# Patient Record
Sex: Male | Born: 1980 | Race: White | Hispanic: No | Smoking: Current every day smoker
Health system: Southern US, Community
[De-identification: ages and names within clinical notes are randomized; demographics above are authoritative.]

## PROBLEM LIST (undated history)

## (undated) DIAGNOSIS — R569 Unspecified convulsions: Secondary | ICD-10-CM

## (undated) DIAGNOSIS — F329 Major depressive disorder, single episode, unspecified: Secondary | ICD-10-CM

## (undated) DIAGNOSIS — F32A Depression, unspecified: Secondary | ICD-10-CM

## (undated) DIAGNOSIS — S62109A Fracture of unspecified carpal bone, unspecified wrist, initial encounter for closed fracture: Secondary | ICD-10-CM

## (undated) DIAGNOSIS — F419 Anxiety disorder, unspecified: Secondary | ICD-10-CM

## (undated) HISTORY — DX: Anxiety disorder, unspecified: F41.9

## (undated) HISTORY — DX: Fracture of unspecified carpal bone, unspecified wrist, initial encounter for closed fracture: S62.109A

## (undated) HISTORY — DX: Unspecified convulsions: R56.9

## (undated) HISTORY — DX: Depression, unspecified: F32.A

## (undated) HISTORY — DX: Major depressive disorder, single episode, unspecified: F32.9

---

## 2003-02-07 ENCOUNTER — Emergency Department (HOSPITAL_COMMUNITY): Admission: EM | Admit: 2003-02-07 | Discharge: 2003-02-07 | Payer: Self-pay | Admitting: Emergency Medicine

## 2003-02-07 ENCOUNTER — Encounter: Payer: Self-pay | Admitting: Emergency Medicine

## 2006-01-15 ENCOUNTER — Emergency Department: Payer: Self-pay | Admitting: Emergency Medicine

## 2006-06-18 ENCOUNTER — Emergency Department: Payer: Self-pay | Admitting: General Practice

## 2006-07-19 ENCOUNTER — Emergency Department: Payer: Self-pay | Admitting: Emergency Medicine

## 2006-07-29 ENCOUNTER — Ambulatory Visit: Payer: Self-pay | Admitting: Family Medicine

## 2006-08-12 ENCOUNTER — Emergency Department: Payer: Self-pay | Admitting: Unknown Physician Specialty

## 2006-08-12 ENCOUNTER — Emergency Department: Payer: Self-pay | Admitting: General Practice

## 2008-01-16 ENCOUNTER — Inpatient Hospital Stay: Payer: Self-pay | Admitting: Unknown Physician Specialty

## 2010-10-24 ENCOUNTER — Emergency Department: Payer: Self-pay | Admitting: Emergency Medicine

## 2011-10-19 ENCOUNTER — Ambulatory Visit: Payer: Self-pay

## 2013-01-19 ENCOUNTER — Ambulatory Visit: Payer: Self-pay | Admitting: Family Medicine

## 2013-02-16 ENCOUNTER — Ambulatory Visit: Payer: Self-pay | Admitting: Family Medicine

## 2013-02-17 ENCOUNTER — Ambulatory Visit: Payer: Self-pay | Admitting: Family Medicine

## 2013-08-31 ENCOUNTER — Ambulatory Visit: Payer: Self-pay

## 2013-09-07 ENCOUNTER — Ambulatory Visit: Payer: Self-pay

## 2014-01-03 DIAGNOSIS — Z720 Tobacco use: Secondary | ICD-10-CM | POA: Insufficient documentation

## 2014-04-06 ENCOUNTER — Emergency Department: Payer: Self-pay | Admitting: Student

## 2015-03-27 ENCOUNTER — Other Ambulatory Visit: Payer: Self-pay | Admitting: Unknown Physician Specialty

## 2015-03-27 MED ORDER — PREGABALIN 200 MG PO CAPS: 200.0000 mg | ORAL_CAPSULE | Freq: Two times a day (BID) | ORAL | Status: DC

## 2015-03-27 NOTE — Telephone Encounter (Signed)
Patient was last seen in Nov 2014. He has not bee prescribed dilatin since 2014 and its not even in his current medications in [facility]. Not sure what you want to do with this but [facility] number is [phone] and pharmacy is [facility] [place].

## 2015-03-27 NOTE — Telephone Encounter (Signed)
Pt called wants to know if enough dilantin and lyrica can be sent to the pharmacy to last until his appt on 04/02/15. Pharm is [facility] [place]. Thanks.

## 2015-03-28 ENCOUNTER — Other Ambulatory Visit: Payer: Self-pay | Admitting: Unknown Physician Specialty

## 2015-04-02 ENCOUNTER — Ambulatory Visit: Payer: Self-pay | Admitting: Unknown Physician Specialty

## 2015-04-02 DIAGNOSIS — F329 Major depressive disorder, single episode, unspecified: Secondary | ICD-10-CM | POA: Insufficient documentation

## 2015-04-02 DIAGNOSIS — F32A Depression, unspecified: Secondary | ICD-10-CM | POA: Insufficient documentation

## 2015-04-02 DIAGNOSIS — G43909 Migraine, unspecified, not intractable, without status migrainosus: Secondary | ICD-10-CM | POA: Insufficient documentation

## 2015-04-02 DIAGNOSIS — F419 Anxiety disorder, unspecified: Secondary | ICD-10-CM

## 2015-04-02 DIAGNOSIS — R51 Headache: Secondary | ICD-10-CM

## 2015-04-02 DIAGNOSIS — R519 Headache, unspecified: Secondary | ICD-10-CM

## 2015-04-02 DIAGNOSIS — G40909 Epilepsy, unspecified, not intractable, without status epilepticus: Secondary | ICD-10-CM | POA: Insufficient documentation

## 2015-04-02 DIAGNOSIS — F17200 Nicotine dependence, unspecified, uncomplicated: Secondary | ICD-10-CM

## 2015-04-02 DIAGNOSIS — F17210 Nicotine dependence, cigarettes, uncomplicated: Secondary | ICD-10-CM | POA: Insufficient documentation

## 2015-04-09 ENCOUNTER — Ambulatory Visit: Payer: Self-pay | Admitting: Unknown Physician Specialty

## 2015-04-16 ENCOUNTER — Ambulatory Visit (INDEPENDENT_AMBULATORY_CARE_PROVIDER_SITE_OTHER): Payer: Medicaid Other | Admitting: Unknown Physician Specialty

## 2015-04-16 ENCOUNTER — Encounter: Payer: Self-pay | Admitting: Unknown Physician Specialty

## 2015-04-16 VITALS — BP 111/72 | HR 58 | Temp 97.2°F | Ht 74.2 in | Wt 143.8 lb

## 2015-04-16 DIAGNOSIS — Z5181 Encounter for therapeutic drug level monitoring: Secondary | ICD-10-CM

## 2015-04-16 DIAGNOSIS — F32A Depression, unspecified: Secondary | ICD-10-CM

## 2015-04-16 DIAGNOSIS — G40909 Epilepsy, unspecified, not intractable, without status epilepticus: Secondary | ICD-10-CM

## 2015-04-16 DIAGNOSIS — F329 Major depressive disorder, single episode, unspecified: Secondary | ICD-10-CM | POA: Diagnosis not present

## 2015-04-16 MED ORDER — PREGABALIN 200 MG PO CAPS: 200.0000 mg | ORAL_CAPSULE | Freq: Two times a day (BID) | ORAL | Status: DC

## 2015-04-16 NOTE — Assessment & Plan Note (Signed)
Check dilantin levels.  Also on Lyrica for unknown reason.

## 2015-04-16 NOTE — Progress Notes (Signed)
BP 111/72 mmHg  Pulse 58  Temp(Src) 97.2 F (36.2 C)  Ht 6' 2.2" (1.885 m)  Wt 143 lb 12.8 oz (65.227 kg)  BMI 18.36 kg/m2  SpO2 99%   Subjective:    Patient ID: Edward Welch, male    DOB: [birth year], 34 y.o.   MRN: [id number]  HPI: [name] is a 34 y.o. male  Chief Complaint  Patient presents with  . Medication Refill    pt states he needs a refill on Lyrica  . Depression   Pt states he is taking Lyrica for depression and mood swings.  States he went to the hospital a couple of years ago after breaking up with his girlfriend.  He claims he has had no follow-up with a psychiatrist since this time and doesn't recall being referred.  PHQ 9 is 5.  Pt states if he doesn't take the Lyrica he gets mod swings  Seizure: seizure free for about a year.  Has dilantin.    Relevant past medical, surgical, family and social history reviewed and updated as indicated. Interim medical history since our last visit reviewed. Allergies and medications reviewed and updated.  Review of Systems  Constitutional: Negative.   HENT: Negative.   Eyes: Negative.   Respiratory: Negative.   Cardiovascular: Negative.   Gastrointestinal: Negative.   Endocrine: Negative.   Genitourinary: Negative.   Musculoskeletal: Negative.   Skin: Negative.   Allergic/Immunologic: Negative.   Neurological: Negative.   Psychiatric/Behavioral: Negative.     Per HPI unless specifically indicated above     Objective:    BP 111/72 mmHg  Pulse 58  Temp(Src) 97.2 F (36.2 C)  Ht 6' 2.2" (1.885 m)  Wt 143 lb 12.8 oz (65.227 kg)  BMI 18.36 kg/m2  SpO2 99%  Wt Readings from Last 3 Encounters:  04/16/15 143 lb 12.8 oz (65.227 kg)  09/20/13 136 lb (61.689 kg)    Physical Exam  Constitutional: He is oriented to person, place, and time. He appears well-developed and well-nourished. No distress.  HENT:  Head: Normocephalic and atraumatic.  Eyes: Conjunctivae and lids are normal. Right eye exhibits  no discharge. Left eye exhibits no discharge. No scleral icterus.  Cardiovascular: Normal rate, regular rhythm and normal heart sounds.   Pulmonary/Chest: Effort normal and breath sounds normal. No respiratory distress.  Abdominal: Normal appearance. There is no splenomegaly or hepatomegaly.  Musculoskeletal: Normal range of motion.  Neurological: He is alert and oriented to person, place, and time.  Skin: Skin is intact. No rash noted. No pallor.  Psychiatric: He has a normal mood and affect. His behavior is normal. Judgment and thought content normal.    No results found for this or any previous visit.    Assessment & Plan:   Problem List Items Addressed This Visit      Unprioritized   Depression    Pt states Lyricais for depression.  I will refer to [facility] for further management.        Relevant Orders   Ambulatory referral to Psychiatry   Seizure disorder - Primary    Check dilantin levels.  Also on Lyrica for unknown reason.         Other Visit Diagnoses    Encounter for medication monitoring        Relevant Orders    Comprehensive metabolic panel    CBC with Differential/Platelet        Follow up plan: No Follow-up on file.

## 2015-04-16 NOTE — Assessment & Plan Note (Signed)
Pt states Lyricais for depression.  I will refer to [facility] for further management.

## 2015-04-17 LAB — CBC WITH DIFFERENTIAL/PLATELET
Basophils Absolute: 0.1 10*3/uL (ref 0.0–0.2)
Basos: 1 %
EOS (ABSOLUTE): 0.3 10*3/uL (ref 0.0–0.4)
EOS: 3 %
HEMATOCRIT: 47.4 % (ref 37.5–51.0)
Hemoglobin: 16.6 g/dL (ref 12.6–17.7)
IMMATURE GRANS (ABS): 0 10*3/uL (ref 0.0–0.1)
IMMATURE GRANULOCYTES: 0 %
LYMPHS: 35 %
Lymphocytes Absolute: 2.9 10*3/uL (ref 0.7–3.1)
MCH: 32.9 pg (ref 26.6–33.0)
MCHC: 35 g/dL (ref 31.5–35.7)
MCV: 94 fL (ref 79–97)
MONOS ABS: 0.7 10*3/uL (ref 0.1–0.9)
Monocytes: 8 %
NEUTROS PCT: 53 %
Neutrophils Absolute: 4.4 10*3/uL (ref 1.4–7.0)
PLATELETS: 203 10*3/uL (ref 150–379)
RBC: 5.04 x10E6/uL (ref 4.14–5.80)
RDW: 12.7 % (ref 12.3–15.4)
WBC: 8.4 10*3/uL (ref 3.4–10.8)

## 2015-04-17 LAB — COMPREHENSIVE METABOLIC PANEL
ALK PHOS: 101 IU/L (ref 39–117)
ALT: 15 IU/L (ref 0–44)
AST: 17 IU/L (ref 0–40)
Albumin/Globulin Ratio: 2 (ref 1.1–2.5)
Albumin: 4.6 g/dL (ref 3.5–5.5)
BUN/Creatinine Ratio: 8 (ref 8–19)
BUN: 9 mg/dL (ref 6–20)
Bilirubin Total: 0.3 mg/dL (ref 0.0–1.2)
CALCIUM: 9.5 mg/dL (ref 8.7–10.2)
CO2: 25 mmol/L (ref 18–29)
CREATININE: 1.09 mg/dL (ref 0.76–1.27)
Chloride: 102 mmol/L (ref 97–108)
GFR calc Af Amer: 103 mL/min/{1.73_m2} (ref >59)
GFR calc non Af Amer: 89 mL/min/{1.73_m2} (ref >59)
GLOBULIN, TOTAL: 2.3 g/dL (ref 1.5–4.5)
GLUCOSE: 101 mg/dL — AB (ref 65–99)
Potassium: 4.8 mmol/L (ref 3.5–5.2)
SODIUM: 140 mmol/L (ref 134–144)
Total Protein: 6.9 g/dL (ref 6.0–8.5)

## 2015-05-01 ENCOUNTER — Ambulatory Visit: Payer: Medicaid Other | Admitting: Licensed Clinical Social Worker

## 2015-05-16 ENCOUNTER — Ambulatory Visit: Payer: Medicaid Other | Admitting: Psychiatry

## 2015-06-27 ENCOUNTER — Emergency Department
Admission: EM | Admit: 2015-06-27 | Discharge: 2015-06-27 | Disposition: A | Discharge status: Home / Self Care | Payer: No Typology Code available for payment source | Attending: Student | Admitting: Student

## 2015-06-27 ENCOUNTER — Emergency Department: Payer: No Typology Code available for payment source

## 2015-06-27 ENCOUNTER — Encounter: Payer: Self-pay | Admitting: Emergency Medicine

## 2015-06-27 DIAGNOSIS — Y92410 Unspecified street and highway as the place of occurrence of the external cause: Secondary | ICD-10-CM | POA: Insufficient documentation

## 2015-06-27 DIAGNOSIS — Z79899 Other long term (current) drug therapy: Secondary | ICD-10-CM | POA: Insufficient documentation

## 2015-06-27 DIAGNOSIS — S43402A Unspecified sprain of left shoulder joint, initial encounter: Secondary | ICD-10-CM | POA: Diagnosis not present

## 2015-06-27 DIAGNOSIS — Y998 Other external cause status: Secondary | ICD-10-CM | POA: Diagnosis not present

## 2015-06-27 DIAGNOSIS — Y9389 Activity, other specified: Secondary | ICD-10-CM | POA: Diagnosis not present

## 2015-06-27 DIAGNOSIS — F17210 Nicotine dependence, cigarettes, uncomplicated: Secondary | ICD-10-CM | POA: Diagnosis not present

## 2015-06-27 DIAGNOSIS — Z880 Allergy status to penicillin: Secondary | ICD-10-CM | POA: Diagnosis not present

## 2015-06-27 DIAGNOSIS — S4992XA Unspecified injury of left shoulder and upper arm, initial encounter: Secondary | ICD-10-CM | POA: Diagnosis present

## 2015-06-27 MED ORDER — CYCLOBENZAPRINE HCL 10 MG PO TABS: 10.0000 mg | ORAL_TABLET | Freq: Three times a day (TID) | ORAL | Status: DC | PRN

## 2015-06-27 MED ORDER — IBUPROFEN 800 MG PO TABS
800.0000 mg | ORAL_TABLET | Freq: Once | ORAL | Status: AC
  Administered 2015-06-27: 800 mg via ORAL
  Filled 2015-06-27: qty 1

## 2015-06-27 MED ORDER — CYCLOBENZAPRINE HCL 10 MG PO TABS
10.0000 mg | ORAL_TABLET | Freq: Once | ORAL | Status: AC
  Administered 2015-06-27: 10 mg via ORAL
  Filled 2015-06-27: qty 1

## 2015-06-27 MED ORDER — IBUPROFEN 800 MG PO TABS: 800.0000 mg | ORAL_TABLET | Freq: Three times a day (TID) | ORAL | Status: DC | PRN

## 2015-06-27 MED ORDER — OXYCODONE-ACETAMINOPHEN 7.5-325 MG PO TABS: 1.0000 | ORAL_TABLET | Freq: Four times a day (QID) | ORAL | Status: DC | PRN

## 2015-06-27 MED ORDER — OXYCODONE-ACETAMINOPHEN 5-325 MG PO TABS
1.0000 | ORAL_TABLET | Freq: Once | ORAL | Status: AC
  Administered 2015-06-27: 1 via ORAL
  Filled 2015-06-27: qty 1

## 2015-06-27 NOTE — Discharge Instructions (Signed)
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
## 2015-10-14 ENCOUNTER — Encounter: Payer: Medicaid Other | Admitting: Unknown Physician Specialty

## 2015-10-14 ENCOUNTER — Encounter: Payer: Self-pay | Admitting: Unknown Physician Specialty

## 2015-11-04 ENCOUNTER — Other Ambulatory Visit: Payer: Self-pay | Admitting: Unknown Physician Specialty

## 2015-11-04 MED ORDER — PREGABALIN 200 MG PO CAPS
200.0000 mg | ORAL_CAPSULE | Freq: Two times a day (BID) | ORAL | Status: DC
Start: 2015-11-04 — End: 2015-12-06

## 2015-11-04 NOTE — Telephone Encounter (Signed)
Routing to provider. Patient has upcoming appointment 12/06/15.

## 2015-11-04 NOTE — Telephone Encounter (Signed)
Pt needs refill for pregabalin (LYRICA) 200 MG capsule sent to [id number] - [place], [place] - [address]

## 2015-12-06 ENCOUNTER — Encounter: Payer: Self-pay | Admitting: Unknown Physician Specialty

## 2015-12-06 ENCOUNTER — Ambulatory Visit (INDEPENDENT_AMBULATORY_CARE_PROVIDER_SITE_OTHER): Payer: Medicaid Other | Admitting: Unknown Physician Specialty

## 2015-12-06 VITALS — BP 140/90 | HR 91 | Temp 97.8°F | Ht 72.2 in | Wt 137.2 lb

## 2015-12-06 DIAGNOSIS — Z0000 Encounter for general adult medical examination without abnormal findings: Secondary | ICD-10-CM | POA: Diagnosis not present

## 2015-12-06 DIAGNOSIS — Z5181 Encounter for therapeutic drug level monitoring: Secondary | ICD-10-CM

## 2015-12-06 DIAGNOSIS — G40909 Epilepsy, unspecified, not intractable, without status epilepticus: Secondary | ICD-10-CM

## 2015-12-06 DIAGNOSIS — G809 Cerebral palsy, unspecified: Secondary | ICD-10-CM

## 2015-12-06 DIAGNOSIS — F17200 Nicotine dependence, unspecified, uncomplicated: Secondary | ICD-10-CM | POA: Diagnosis not present

## 2015-12-06 MED ORDER — PHENYTOIN SODIUM EXTENDED 100 MG PO CAPS: 400.0000 mg | ORAL_CAPSULE | Freq: Every day | ORAL | Status: DC

## 2015-12-06 MED ORDER — PREGABALIN 200 MG PO CAPS: 200.0000 mg | ORAL_CAPSULE | Freq: Every day | ORAL | Status: DC

## 2015-12-06 NOTE — Assessment & Plan Note (Signed)
Stable.  No seizures for 2 years
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
## 2015-12-06 NOTE — Assessment & Plan Note (Signed)
Encouraged to quit smoking.  

## 2015-12-07 LAB — CBC WITH DIFFERENTIAL/PLATELET
BASOS: 1 %
Basophils Absolute: 0.1 10*3/uL (ref 0.0–0.2)
EOS (ABSOLUTE): 0.1 10*3/uL (ref 0.0–0.4)
EOS: 1 %
HEMOGLOBIN: 16.6 g/dL (ref 12.6–17.7)
Hematocrit: 48 % (ref 37.5–51.0)
IMMATURE GRANS (ABS): 0 10*3/uL (ref 0.0–0.1)
IMMATURE GRANULOCYTES: 0 %
LYMPHS: 28 %
Lymphocytes Absolute: 2.7 10*3/uL (ref 0.7–3.1)
MCH: 32.1 pg (ref 26.6–33.0)
MCHC: 34.6 g/dL (ref 31.5–35.7)
MCV: 93 fL (ref 79–97)
MONOCYTES: 10 %
Monocytes Absolute: 0.9 10*3/uL (ref 0.1–0.9)
NEUTROS PCT: 60 %
Neutrophils Absolute: 5.8 10*3/uL (ref 1.4–7.0)
PLATELETS: 240 10*3/uL (ref 150–379)
RBC: 5.17 x10E6/uL (ref 4.14–5.80)
RDW: 13 % (ref 12.3–15.4)
WBC: 9.6 10*3/uL (ref 3.4–10.8)

## 2015-12-07 LAB — LIPID PANEL W/O CHOL/HDL RATIO
Cholesterol, Total: 120 mg/dL (ref 100–199)
HDL: 44 mg/dL (ref >39)
LDL Calculated: 64 mg/dL (ref 0–99)
Triglycerides: 60 mg/dL (ref 0–149)
VLDL CHOLESTEROL CAL: 12 mg/dL (ref 5–40)

## 2015-12-07 LAB — COMPREHENSIVE METABOLIC PANEL
ALT: 13 IU/L (ref 0–44)
AST: 16 IU/L (ref 0–40)
Albumin/Globulin Ratio: 1.8 (ref 1.2–2.2)
Albumin: 4.8 g/dL (ref 3.5–5.5)
Alkaline Phosphatase: 158 IU/L — ABNORMAL HIGH (ref 39–117)
BUN/Creatinine Ratio: 10 (ref 9–20)
BUN: 11 mg/dL (ref 6–20)
Bilirubin Total: 0.6 mg/dL (ref 0.0–1.2)
CALCIUM: 9.9 mg/dL (ref 8.7–10.2)
CO2: 26 mmol/L (ref 18–29)
CREATININE: 1.15 mg/dL (ref 0.76–1.27)
Chloride: 97 mmol/L (ref 96–106)
GFR calc Af Amer: 96 mL/min/{1.73_m2} (ref >59)
GFR, EST NON AFRICAN AMERICAN: 83 mL/min/{1.73_m2} (ref >59)
Globulin, Total: 2.7 g/dL (ref 1.5–4.5)
Glucose: 61 mg/dL — ABNORMAL LOW (ref 65–99)
POTASSIUM: 4.1 mmol/L (ref 3.5–5.2)
Sodium: 142 mmol/L (ref 134–144)
TOTAL PROTEIN: 7.5 g/dL (ref 6.0–8.5)

## 2015-12-07 LAB — HIV ANTIBODY (ROUTINE TESTING W REFLEX): HIV SCREEN 4TH GENERATION: NONREACTIVE

## 2015-12-07 LAB — PHENYTOIN LEVEL, TOTAL: Phenytoin (Dilantin), Serum: 9.8 ug/mL — ABNORMAL LOW (ref 10.0–20.0)

## 2015-12-09 ENCOUNTER — Encounter: Payer: Self-pay | Admitting: Unknown Physician Specialty

## 2016-03-30 ENCOUNTER — Other Ambulatory Visit: Payer: Self-pay | Admitting: Unknown Physician Specialty

## 2016-03-31 NOTE — Telephone Encounter (Signed)
Your patient 

## 2016-05-15 ENCOUNTER — Emergency Department
Admission: EM | Admit: 2016-05-15 | Discharge: 2016-05-15 | Disposition: A | Discharge status: Home / Self Care | Payer: Medicaid Other | Attending: Emergency Medicine | Admitting: Emergency Medicine

## 2016-05-15 DIAGNOSIS — F17210 Nicotine dependence, cigarettes, uncomplicated: Secondary | ICD-10-CM | POA: Insufficient documentation

## 2016-05-15 DIAGNOSIS — K0889 Other specified disorders of teeth and supporting structures: Secondary | ICD-10-CM | POA: Diagnosis present

## 2016-05-15 DIAGNOSIS — K047 Periapical abscess without sinus: Secondary | ICD-10-CM | POA: Insufficient documentation

## 2016-05-15 MED ORDER — OXYCODONE-ACETAMINOPHEN 7.5-325 MG PO TABS: 1.0000 | ORAL_TABLET | ORAL | 0 refills | Status: DC | PRN

## 2016-05-15 MED ORDER — ERYTHROMYCIN BASE 500 MG PO TABS: 500.0000 mg | ORAL_TABLET | Freq: Four times a day (QID) | ORAL | 0 refills | Status: AC

## 2016-05-15 NOTE — ED Notes (Signed)
Tooth pain for about 1 week  Min swelling noted .  increased pain over the past few days

## 2016-05-15 NOTE — ED Provider Notes (Signed)
[facility] Emergency Department Provider Note   ____________________________________________   First MD Initiated Contact with Patient 05/15/16 1518     (approximate)  I have reviewed the triage vital signs and the nursing notes.   HISTORY  Chief Complaint Dental Pain    HPI Edward Welch is a 35 y.o. male patient complaining of 1 week of right lower dental pain. Patient stated pain has increased the last 24 hours. Patient has a history of devitalized teeth. Patient has not follow-up with dentist. Patient rates pain as a 9/10. Patient state conservative anti-inflammatory medication is not helping.   Past Medical History:  Diagnosis Date  . Anxiety   . Broken wrist   . Depression   . Seizures [facility](HCC)     Patient Active Problem List   Diagnosis Date Noted  . Depression 04/02/2015  . Seizure disorder (HCC) 04/02/2015  . Tobacco dependence 04/02/2015  . Headache 04/02/2015  . Migraine 04/02/2015  . Acute anxiety 04/02/2015    No past surgical history on file.  Prior to Admission medications   Medication Sig Start Date End Date Taking? Authorizing Provider  erythromycin base (E-MYCIN) 500 MG tablet Take 1 tablet (500 mg total) by mouth 4 (four) times daily. 05/15/16 05/25/16  Joni Reiningonald K Smith, PA-C  oxyCODONE-acetaminophen (PERCOCET) 7.5-325 MG tablet Take 1 tablet by mouth every 4 (four) hours as needed for severe pain. 05/15/16   Joni Reiningonald K Smith, PA-C  phenytoin (DILANTIN) 100 MG ER capsule TAKE 4 CAPSULES BY MOUTH DAILY 03/31/16   Gabriel Cirriheryl Wicker, NP  pregabalin (LYRICA) 200 MG capsule Take 1 capsule (200 mg total) by mouth daily. 12/06/15   Gabriel Cirriheryl Wicker, NP    Allergies Amoxil [amoxicillin] and Penicillins  Family History  Problem Relation Age of Onset  . Diabetes Mother   . Hypertension Father   . Seizures Sister   . Allergies Daughter   . Asthma Son   . Heart disease Maternal Grandmother     Social History Social History    Substance Use Topics  . Smoking status: Current Every Day Smoker    Packs/day: 1.00    Types: Cigarettes  . Smokeless tobacco: Former NeurosurgeonUser  . Alcohol use 0.0 oz/week     Comment: on occasion    Review of Systems Constitutional: No fever/chills Eyes: No visual changes. ENT: No sore throat. Cardiovascular: Denies chest pain. Respiratory: Denies shortness of breath. Gastrointestinal: No abdominal pain.  No nausea, no vomiting.  No diarrhea.  No constipation. Genitourinary: Negative for dysuria. Musculoskeletal: Negative for back pain. Skin: Negative for rash. Neurological: Negative for headaches, focal weakness or numbness.History of seizure disorder Psychiatric:Anxiety and depression. Allergic/Immunilogical: Penicillin ____________________________________________   PHYSICAL EXAM:  VITAL SIGNS: ED Triage Vitals [05/15/16 1434]  Enc Vitals Group     BP 137/78     Pulse Rate 77     Resp 18     Temp 98.8 F (37.1 C)     Temp Source Oral     SpO2      Weight 150 lb (68 kg)     Height 6\' 3"  (1.905 m)     Head Circumference      Peak Flow      Pain Score 9     Pain Loc      Pain Edu?      Excl. in GC?     Constitutional: Alert and oriented. Well appearing and in no acute distress. Eyes: Conjunctivae are normal. PERRL. EOMI. Head: Atraumatic. Nose:  No congestion/rhinnorhea. Mouth/Throat: Mucous membranes are moist.  Oropharynx non-erythematous.Multiple caries upper and lower molar area. Edematous gingiva at tooth #20. Neck: No stridor.  No cervical spine tenderness to palpation. Hematological/Lymphatic/Immunilogical: No cervical lymphadenopathy. Cardiovascular: Normal rate, regular rhythm. Grossly normal heart sounds.  Good peripheral circulation. Respiratory: Normal respiratory effort.  No retractions. Lungs CTAB. Gastrointestinal: Soft and nontender. No distention. No abdominal bruits. No CVA tenderness. Musculoskeletal: No lower extremity tenderness nor edema.  No  joint effusions. Neurologic:  Normal speech and language. No gross focal neurologic deficits are appreciated. No gait instability. Skin:  Skin is warm, dry and intact. No rash noted. Psychiatric: Mood and affect are normal. Speech and behavior are normal.  ____________________________________________   LABS (all labs ordered are listed, but only abnormal results are displayed)  Labs Reviewed - No data to display ____________________________________________  EKG   ____________________________________________  RADIOLOGY   ____________________________________________   PROCEDURES  Procedure(s) performed:   Procedures  Critical Care performed: No  ____________________________________________   INITIAL IMPRESSION / ASSESSMENT AND PLAN / ED COURSE  Pertinent labs & imaging results that were available during my care of the patient were reviewed by me and considered in my medical decision making (see chart for details).  Dental pain. Patient given discharge Instructions. Patient given a prescription for tamoxifen and tramadol. Patient given a list of dental clinics for follow-up care.  Clinical Course     ____________________________________________   FINAL CLINICAL IMPRESSION(S) / ED DIAGNOSES  Final diagnoses:  Dental abscess      NEW MEDICATIONS STARTED DURING THIS VISIT:  New Prescriptions   ERYTHROMYCIN BASE (E-MYCIN) 500 MG TABLET    Take 1 tablet (500 mg total) by mouth 4 (four) times daily.   OXYCODONE-ACETAMINOPHEN (PERCOCET) 7.5-325 MG TABLET    Take 1 tablet by mouth every 4 (four) hours as needed for severe pain.     Note:  This document was prepared using Dragon voice recognition software and may include unintentional dictation errors.    Joni Reining, PA-C 05/15/16 1537    Sharman Cheek, MD 05/15/16 [phone]

## 2016-05-15 NOTE — ED Triage Notes (Signed)
Pt arrives today with reports of right sided lower dental pain that has been getting worse over the last week

## 2016-05-15 NOTE — Discharge Instructions (Signed)
OPTIONS FOR DENTAL FOLLOW UP CARE ° °[place] Department of Health and Human Services - Local Safety Net Dental Clinics °http://www.ncdhhs.gov/dph/oralhealth/services/safetynetclinics.htm °  °[facility] (336-562-3123) ° °[facility] [place] ([phone]) ° °[facility] ([phone]) ° °[facility] ([phone]) ° °[facility] ([phone]) °This clinic caters to the indigent population and is on a lottery system. °Location: °[facility], [address], [place] °Clinic Hours: °Wednesdays from 6pm - 9pm, patients seen by a lottery system. °For dates, call or go to [email or web address] °Services: °Cleanings, fillings and simple extractions. °Payment Options: °DENTAL WORK IS FREE OF CHARGE. Bring proof of income or support. °Best way to get seen: °Arrive at 5:15 pm - this is a lottery, NOT first come/first serve, so arriving earlier will not increase your chances of being seen. °  °  °[facility] °[phone] °Select option 1 for emergencies °  °Location: °[facility], [address], [place] °Clinic Hours: °No walk-ins accepted - call the day before to schedule an appointment. °Check in times are 9:30 am and 1:30 pm. °Services: °Simple extractions, temporary fillings, pulpectomy/pulp debridement, uncomplicated abscess drainage. °Payment Options: °PAYMENT IS DUE AT THE TIME OF SERVICE.  Fee is usually $100-200, additional surgical procedures (e.g. abscess drainage) may be extra. °Cash, checks, Visa/MasterCard accepted.  Can file Medicaid if patient is covered for dental - patient should call case worker to check. °No discount for [facility] patients. °Best way to get seen: °MUST call the day before and get onto the schedule. Can usually be seen the next 1-2 days. No walk-ins accepted. °  °  °[facility] °[phone] °   °Location: °[facility], [address], [place] °Clinic Hours: °M, W, Th, F 8am or 1:30pm, Tues 9a or 1:30 - first come/first served. °Services: °Simple extractions, temporary fillings, uncomplicated abscess drainage.  You do not need to be an [place] resident. °Payment Options: °PAYMENT IS DUE AT THE TIME OF SERVICE. °Dental insurance, otherwise sliding scale - bring proof of income or support. °Depending on income and treatment needed, cost is usually $50-200. °Best way to get seen: °Arrive early as it is first come/first served. °  °  °[facility] °[phone] °  °Location: °[address] °Clinic Hours: °Mon-Thu 8a-5p °Services: °Most basic dental services including extractions and fillings. °Payment Options: °PAYMENT IS DUE AT THE TIME OF SERVICE. °Sliding scale, up to 50% off - bring proof if income or support. °Medicaid with dental option accepted. °Best way to get seen: °Call to schedule an appointment, can usually be seen within 2 weeks OR they will try to see walk-ins - show up at 8a or 2p (you may have to wait). °  °  °[facility] °[phone] °[place] RESIDENTS ONLY °  °Location: °[facility], [address], [place], [place] [address] °Clinic Hours: By appointment only. °Monday - Thursday 8am-5pm, Friday 8am-12pm °Services: Cleanings, fillings, extractions. °Payment Options: °PAYMENT IS DUE AT THE TIME OF SERVICE. °Cash, Visa or MasterCard. Sliding scale - $30 minimum per service. °Best way to get seen: °Come in to office, complete packet and make an appointment - need proof of income °or support monies for each household member and proof of [place] residence. °Usually takes about a month to get in. °  °  °[facility] °[phone] °  °Location: °[address]   [place] °Clinic Hours: Walk-in Urgent Care Dental Services are offered Monday-Friday  mornings only. °The numbers of emergencies accepted daily is limited to the number of °providers available. °Maximum 15 - Mondays, Wednesdays & Thursdays °Maximum 10 - Tuesdays & Fridays °Services: °You do not need to be a [place] resident to be seen for a dental emergency. °Emergencies are defined as pain, swelling, abnormal bleeding, or dental trauma. Walkins will receive x-rays if needed. °NOTE: Dental cleaning is not an emergency. °Payment Options: °PAYMENT IS DUE AT THE TIME OF SERVICE. °Minimum co-pay is $40.00 for uninsured patients. °Minimum co-pay is $3.00 for Medicaid with dental coverage. °Dental Insurance is accepted and must be presented at time of visit. °Medicare does not cover dental. °Forms of payment: Cash, credit card, checks. °Best way to get seen: °If not previously registered with the clinic, walk-in dental registration begins at 7:15 am and is on a first come/first serve basis. °If previously registered with the clinic, call to make an appointment. °  °  °[facility] °[phone] °[place] RESIDENTS ONLY °  °Location: °[address], [place], [place] °Clinic Hours: °Mon-Thu 10a-2p °Services: Extractions only! °Payment Options: °FREE (donations accepted) - bring proof of income or support °Best way to get seen: °Call and schedule an appointment OR come at 8am on the 1st Monday of every month (except for holidays) when it is first come/first served. °  °  °[facility] °[phone] °  °Location: °[address], [place] °Clinic Hours: °Friday mornings °Services, Payment Options, Best way to get seen: °Call for info °

## 2016-07-03 ENCOUNTER — Other Ambulatory Visit: Payer: Self-pay | Admitting: Unknown Physician Specialty

## 2016-09-01 ENCOUNTER — Encounter: Payer: Self-pay | Admitting: Emergency Medicine

## 2016-09-01 ENCOUNTER — Emergency Department
Admission: EM | Admit: 2016-09-01 | Discharge: 2016-09-01 | Disposition: A | Discharge status: Home / Self Care | Payer: Medicaid Other | Attending: Emergency Medicine | Admitting: Emergency Medicine

## 2016-09-01 DIAGNOSIS — F17210 Nicotine dependence, cigarettes, uncomplicated: Secondary | ICD-10-CM | POA: Insufficient documentation

## 2016-09-01 DIAGNOSIS — K0889 Other specified disorders of teeth and supporting structures: Secondary | ICD-10-CM | POA: Diagnosis present

## 2016-09-01 DIAGNOSIS — K047 Periapical abscess without sinus: Secondary | ICD-10-CM | POA: Diagnosis not present

## 2016-09-01 MED ORDER — CLINDAMYCIN PHOSPHATE 900 MG/6ML IJ SOLN
600.0000 mg | Freq: Once | INTRAMUSCULAR | Status: AC
  Administered 2016-09-01: 600 mg via INTRAMUSCULAR

## 2016-09-01 MED ORDER — TRAMADOL HCL 50 MG PO TABS: 50.0000 mg | ORAL_TABLET | Freq: Four times a day (QID) | ORAL | 0 refills | Status: AC | PRN

## 2016-09-01 MED ORDER — CLINDAMYCIN HCL 300 MG PO CAPS: 300.0000 mg | ORAL_CAPSULE | Freq: Four times a day (QID) | ORAL | 0 refills | Status: AC

## 2016-09-01 MED ORDER — CLINDAMYCIN PHOSPHATE 300 MG/2ML IJ SOLN
INTRAMUSCULAR | Status: AC
  Administered 2016-09-01: 600 mg via INTRAMUSCULAR
  Filled 2016-09-01: qty 2

## 2016-09-01 NOTE — ED Triage Notes (Signed)
Developed tooth pain with swelling about 2 days ago

## 2016-09-01 NOTE — ED Provider Notes (Signed)
[facility] Emergency Department Provider Note  ____________________________________________  Time seen: Approximately 12:35 PM  I have reviewed the triage vital signs and the nursing notes.   HISTORY  Chief Complaint Dental Pain    HPI Edward Welch is a 36 y.o. male that presents with lower right tooth pain for 2 days.Patient states that he is drinking normally but is having difficulty chewing food. Patient states that area around tooth is tender to touch. Patient denies any drainage. Patient has taken ibuprofen for pain. Patient states that he saw the dentist 2 months ago and was told that he has no cavities. Patient denies fever, jaw pain, difficulty swallowing, shortness of breath, chest pain, nausea, vomiting, abdominal pain.   Past Medical History:  Diagnosis Date  . Anxiety   . Broken wrist   . Depression   . Seizures [facility])     Patient Active Problem List   Diagnosis Date Noted  . Depression 04/02/2015  . Seizure disorder (HCC) 04/02/2015  . Tobacco dependence 04/02/2015  . Headache 04/02/2015  . Migraine 04/02/2015  . Acute anxiety 04/02/2015    History reviewed. No pertinent surgical history.  Prior to Admission medications   Medication Sig Start Date End Date Taking? Authorizing Provider  clindamycin (CLEOCIN) 300 MG capsule Take 1 capsule (300 mg total) by mouth 4 (four) times daily. 09/01/16 09/11/16  Enid Derry, PA-C  oxyCODONE-acetaminophen (PERCOCET) 7.5-325 MG tablet Take 1 tablet by mouth every 4 (four) hours as needed for severe pain. 05/15/16   Joni Reining, PA-C  phenytoin (DILANTIN) 100 MG ER capsule TAKE 4 CAPSULES BY MOUTH DAILY 07/06/16   Gabriel Cirri, NP  pregabalin (LYRICA) 200 MG capsule Take 1 capsule (200 mg total) by mouth daily. 12/06/15   Gabriel Cirri, NP  traMADol (ULTRAM) 50 MG tablet Take 1 tablet (50 mg total) by mouth every 6 (six) hours as needed. 09/01/16 09/01/17  Enid Derry, PA-C     Allergies Amoxil [amoxicillin] and Penicillins  Family History  Problem Relation Age of Onset  . Diabetes Mother   . Hypertension Father   . Seizures Sister   . Allergies Daughter   . Asthma Son   . Heart disease Maternal Grandmother     Social History Social History  Substance Use Topics  . Smoking status: Current Every Day Smoker    Packs/day: 1.00    Types: Cigarettes  . Smokeless tobacco: Former Neurosurgeon  . Alcohol use 0.0 oz/week     Comment: on occasion     Review of Systems  Constitutional: No fever/chills Cardiovascular: No chest pain. Respiratory: No SOB. Gastrointestinal: No abdominal pain.  No nausea, no vomiting. Musculoskeletal: Negative for musculoskeletal pain. Skin: Negative for rash, abrasions, lacerations, ecchymosis. Neurological: Negative for headaches, numbness or tingling   ____________________________________________   PHYSICAL EXAM:  VITAL SIGNS: ED Triage Vitals [09/01/16 1105]  Enc Vitals Group     BP (!) 139/113     Pulse Rate 78     Resp 16     Temp      Temp src      SpO2 98 %     Weight      Height      Head Circumference      Peak Flow      Pain Score 10     Pain Loc      Pain Edu?      Excl. in GC?      Constitutional: Alert and oriented. Well appearing  and in no acute distress. Eyes: Conjunctivae are normal. PERRL. EOMI. Head: Atraumatic. ENT:      Ears:      Nose: No congestion/rhinnorhea.      Mouth/Throat: Mucous membranes are moist. Multiple cavities and broken teeth present. Tooth #32 almost completely decayed. Pus draining from center of tooth. Area around tooth is tender to palpation. No areas of fluctuance or induration on buccal or mucosal surfaces. No swelling over the cheek or neck. Oropharynx non-erythematous. Tonsils not enlarged. Uvula midline. No TMJ pain. No trismus. Neck: No stridor.  No cervical spine tenderness to palpation. Hematological/Lymphatic/Immunilogical: No cervical  lymphadenopathy. Cardiovascular: Normal rate, regular rhythm.   Good peripheral circulation. Respiratory: Normal respiratory effort without tachypnea or retractions. Lungs CTAB. Good air entry to the bases with no decreased or absent breath sounds. Musculoskeletal: Full range of motion to all extremities. No gross deformities appreciated. Neurologic:  Normal speech and language. No gross focal neurologic deficits are appreciated.  Skin:  Skin is warm, dry and intact. No rash noted. Psychiatric: Mood and affect are normal. Speech and behavior are normal.    ____________________________________________   LABS (all labs ordered are listed, but only abnormal results are displayed)  Labs Reviewed - No data to display ____________________________________________  EKG   ____________________________________________  RADIOLOGY   No results found.  ____________________________________________    PROCEDURES  Procedure(s) performed:    Procedures    Medications  clindamycin (CLEOCIN) injection 600 mg (600 mg Intramuscular Given 09/01/16 1201)     ____________________________________________   INITIAL IMPRESSION / ASSESSMENT AND PLAN / ED COURSE  Pertinent labs & imaging results that were available during my care of the patient were reviewed by me and considered in my medical decision making (see chart for details).  Review of the [place] CSRS was performed in accordance of the [facility] prior to dispensing any controlled drugs.     Patient's diagnosis is consistent with dental abscess. Vital signs and exam are reassuring. No signs of trismus or Ludwig's angina. No drainable abscess. Patient was given IM dose of clindamycin in ED. Patient was highly encouraged to go to dental walk-in clinic this afternoon in [place]. Patient will be discharged home with prescriptions for clindamycin and tramadol. Patient is to follow up with dentist as directed. Patient is given ED precautions to  return to the ED for any worsening or new symptoms.     ____________________________________________  FINAL CLINICAL IMPRESSION(S) / ED DIAGNOSES  Final diagnoses:  Dental abscess      NEW MEDICATIONS STARTED DURING THIS VISIT:  New Prescriptions   CLINDAMYCIN (CLEOCIN) 300 MG CAPSULE    Take 1 capsule (300 mg total) by mouth 4 (four) times daily.   TRAMADOL (ULTRAM) 50 MG TABLET    Take 1 tablet (50 mg total) by mouth every 6 (six) hours as needed.        This chart was dictated using voice recognition software/Dragon. Despite best efforts to proofread, errors can occur which can change the meaning. Any change was purely unintentional.    Enid DerryAshley Nilan Iddings, PA-C 09/01/16 1302    Phineas SemenGraydon Goodman, MD 09/01/16 1311

## 2016-09-01 NOTE — Discharge Instructions (Signed)
[facility]   OPTIONS FOR DENTAL FOLLOW UP CARE  [place] Department of Health and Human Services - Local Safety Net Dental Clinics TripDoors.comhttp://www.ncdhhs.gov/dph/oralhealth/services/safetynetclinics.htm   [facility] [phone])  [facility] [place] [phone])  [facility] [place] [phone]  [facility] [phone])  [facility] [phone]) This clinic caters to the indigent population and is on a lottery system. Location: [employer] [employer] [facility], [address], [place] [facility] Hours: Wednesdays from 6pm - 9pm, patients seen by a lottery system. For dates, call or go to [email or web address] Services: Cleanings, fillings and simple extractions. Payment Options: DENTAL WORK IS FREE OF CHARGE. Bring proof of income or support. Best way to get seen: Arrive at 5:15 pm - this is a lottery, NOT first come/first serve, so arriving earlier will not increase your chances of being seen.     [facility] [phone] Select option 1 for emergencies   Location: [facility], [address], [place] [facility] Hours: No walk-ins accepted - call the day before to schedule an appointment. Check in times are 9:30 am and 1:30 pm. Services: Simple extractions, temporary fillings, pulpectomy/pulp debridement, uncomplicated abscess drainage. Payment Options: PAYMENT IS DUE AT THE TIME OF SERVICE.  Fee is usually $100-200, additional surgical procedures (e.g. abscess drainage) may be extra. Cash, checks, Visa/MasterCard accepted.  Can file Medicaid if patient is covered for dental - patient should call case worker to check. No discount for [facility] patients. Best way to get seen: MUST call the day before and get onto the schedule. Can usually be seen the next 1-2 days. No walk-ins accepted.      [facility] [phone]   Location: [facility], [address], [place] [facility] Hours: M, W, Th, F 8am or 1:30pm, Tues 9a or 1:30 - first come/first served. Services: Simple extractions, temporary fillings, uncomplicated abscess drainage.  You do not need to be an [place] [facility] [place] [facility] [place] resident. Payment Options: PAYMENT IS DUE AT THE TIME OF SERVICE. Dental insurance, otherwise sliding scale - bring proof of income or support. Depending on income and treatment needed, cost is usually $50-200. Best way to get seen: Arrive early as it is first come/first served.     [facility] [phone]   Location: [address] Clinic Hours: Mon-Thu 8a-5p Services: Most basic dental services including extractions and fillings. Payment Options: PAYMENT IS DUE AT THE TIME OF SERVICE. Sliding scale, up to 50% off - bring proof if income or support. Medicaid with dental option accepted. Best way to get seen: Call to schedule an appointment, can usually be seen within 2 weeks OR they will try to see walk-ins - show up at 8a or 2p (you may have to wait).     [facility] [phone] [place] RESIDENTS ONLY   Location: [facility], [address], [place], [place] [id number] Clinic Hours: By appointment only. Monday - Thursday 8am-5pm, Friday 8am-12pm Services: Cleanings, fillings, extractions. Payment Options: PAYMENT IS DUE AT THE TIME OF SERVICE. Cash, Visa or MasterCard. Sliding scale - $30 minimum per service. Best way to get seen: Come in to office, complete packet and make an appointment - need proof of income or support monies for each household member and proof of [facility] [place] [facility] [place] residence. Usually takes about a month to get in.     [facility] [phone]   Location: [address], [facility] Hours: Walk-in Urgent Care  Dental Services are offered Monday-Friday mornings only. The numbers of emergencies accepted daily is limited to the number of providers available. Maximum 15 - Mondays, Wednesdays & Thursdays Maximum 10 - Tuesdays & Fridays Services: You do not need to be a [facility] resident to be seen for a dental emergency. Emergencies are defined as pain, swelling, abnormal bleeding, or dental trauma. Walkins will receive x-rays if needed. NOTE: Dental cleaning is not an emergency. Payment Options: PAYMENT IS DUE AT THE TIME OF SERVICE. Minimum co-pay is $40.00 for uninsured patients. Minimum co-pay is $3.00 for Medicaid with dental coverage. Dental Insurance is accepted and must be presented at time of visit. Medicare does not cover dental. Forms of payment: Cash, credit card, checks. Best way to get seen: If not previously registered with the clinic, walk-in dental registration begins at 7:15 am and is on a first come/first serve basis. If previously registered with the clinic, call to make an appointment.     [facility] [phone] [place] RESIDENTS ONLY   Location: [address], [place], [place] Clinic Hours: Mon-Thu 10a-2p Services: Extractions only! Payment Options: FREE (donations accepted) - bring proof of income or support Best way to get seen: Call and schedule an appointment OR come at 8am on the 1st Monday of every month (except for holidays) when it is first come/first served.     [facility] [phone]   Location: [address] [place], [place] Clinic Hours: Friday mornings Services, Payment Options, Best way to get seen: Call for info

## 2016-09-01 NOTE — ED Notes (Signed)
See triage note  States he may have an abscess to tooth on the right   Min swelling noted to face

## 2016-09-01 NOTE — ED Notes (Signed)
Pt discharged home after verbalizing understanding of discharge instructions; nad noted. 

## 2016-10-13 ENCOUNTER — Other Ambulatory Visit: Payer: Self-pay | Admitting: Unknown Physician Specialty

## 2016-10-14 NOTE — Telephone Encounter (Signed)
Phenytoin prescription called into pharmacy.

## 2017-01-22 ENCOUNTER — Other Ambulatory Visit: Payer: Self-pay | Admitting: Unknown Physician Specialty

## 2017-01-22 NOTE — Telephone Encounter (Signed)
Patient needs a refill on his dilantin it will require auth per patient.  Thank you  [facility]

## 2017-01-25 MED ORDER — PHENYTOIN SODIUM EXTENDED 100 MG PO CAPS: 400.0000 mg | ORAL_CAPSULE | Freq: Every day | ORAL | 0 refills | Status: DC

## 2017-01-25 NOTE — Telephone Encounter (Signed)
Routing to provider  

## 2017-07-23 ENCOUNTER — Other Ambulatory Visit: Payer: Self-pay | Admitting: Unknown Physician Specialty

## 2017-07-27 ENCOUNTER — Other Ambulatory Visit: Payer: Self-pay | Admitting: Unknown Physician Specialty

## 2017-07-28 NOTE — Telephone Encounter (Signed)
Dilantin refill 

## 2017-07-30 ENCOUNTER — Encounter: Payer: Self-pay | Admitting: Unknown Physician Specialty

## 2017-08-01 ENCOUNTER — Other Ambulatory Visit: Payer: Self-pay | Admitting: Unknown Physician Specialty

## 2017-08-02 NOTE — Telephone Encounter (Signed)
Dilantin refill

## 2017-08-13 ENCOUNTER — Other Ambulatory Visit: Payer: Self-pay | Admitting: Unknown Physician Specialty

## 2017-08-13 MED ORDER — PHENYTOIN SODIUM EXTENDED 100 MG PO CAPS: 400.0000 mg | ORAL_CAPSULE | Freq: Every day | ORAL | 0 refills | Status: DC

## 2017-08-13 NOTE — Telephone Encounter (Signed)
RX request sent to provider through a different encounter already.

## 2017-08-13 NOTE — Telephone Encounter (Signed)
Routing to provider  

## 2017-08-13 NOTE — Telephone Encounter (Signed)
Copied from CRM [id number]. Topic: Quick Communication - See Telephone Encounter >> Aug 13, 2017 12:47 PM Windy KalataMichael, Henrietta Cieslewicz L, NT wrote: CRM for notification. See Telephone encounter for:  08/13/17.  Patient is requesting a refill on phenytoin [facility] He has already went there and hey advised him to call the doctor office. He states he has 1 left for tonight. Please advise

## 2017-08-13 NOTE — Telephone Encounter (Signed)
Called and let patient know that medication was sent in. Patient has appointment 08/17/17.

## 2017-08-13 NOTE — Telephone Encounter (Signed)
Request for seizure medication

## 2017-08-13 NOTE — Telephone Encounter (Signed)
He really needs an appointment

## 2017-08-15 ENCOUNTER — Telehealth: Payer: Self-pay | Admitting: Unknown Physician Specialty

## 2017-08-16 NOTE — Telephone Encounter (Signed)
Medication request; last office visit 2017

## 2017-08-16 NOTE — Telephone Encounter (Signed)
Last office visit 2017; medication request

## 2017-08-16 NOTE — Telephone Encounter (Signed)
This was already done Friday. Can we refuse so we can close the encounter?

## 2017-08-17 ENCOUNTER — Encounter: Payer: Self-pay | Admitting: Unknown Physician Specialty

## 2017-08-17 ENCOUNTER — Ambulatory Visit: Payer: Medicaid Other | Admitting: Unknown Physician Specialty

## 2017-08-17 VITALS — BP 112/72 | HR 77 | Temp 98.5°F | Ht 74.1 in | Wt 147.4 lb

## 2017-08-17 DIAGNOSIS — G40909 Epilepsy, unspecified, not intractable, without status epilepticus: Secondary | ICD-10-CM

## 2017-08-17 DIAGNOSIS — F341 Dysthymic disorder: Secondary | ICD-10-CM

## 2017-08-17 MED ORDER — PREGABALIN 200 MG PO CAPS: 200.0000 mg | ORAL_CAPSULE | Freq: Every day | ORAL | 1 refills | Status: DC

## 2017-08-17 NOTE — Progress Notes (Addendum)
BP 112/72   Pulse 77   Temp 98.5 F (36.9 C) (Oral)   Ht 6' 2.1" (1.882 m)   Wt 147 lb 6.4 oz (66.9 kg)   SpO2 98%   BMI 18.87 kg/m    Subjective:    Patient ID: Edward Welch, male    DOB: 30-Jan-1981, 37 y.o.   MRN: [id number]  HPI: Edward Welch is a 37 y.o. male  Chief Complaint  Patient presents with  . Seizures    pt states he had 2 seizures yesterday   He is here with his wife who feels he is stressed and depressed and consequently had 2 seizures.  Pt admits to stress but doesn't want treatment for depression  Depression screen [facility] 2/9 08/17/2017 12/06/2015 04/16/2015  Decreased Interest 0 0 1  Down, Depressed, Hopeless 2 0 -  PHQ - 2 Score 2 0 1  Altered sleeping 0 - -  Tired, decreased energy 0 - -  Change in appetite 0 - -  Feeling bad or failure about yourself  1 - -  Trouble concentrating 0 - -  Moving slowly or fidgety/restless 0 - -  Suicidal thoughts 0 - -  PHQ-9 Score 3 - -    Had 2 grand-mal seizures yesterday.  Had only one 100 mg tablet of Dilantin on Saturday night but following that night, restarted his normal 4 per day.  Was taking Lyrica but out for a period of time.   Reviewed notes from [facility] (DrMalvin Johns) in 2015 who documented Lyrical in addition to Dilantin which was controlling his seizures.     Tobacco Smokes 1ppd.  No interest in quitting at this time.    Depression screen [facility] 2/9 08/17/2017 12/06/2015 04/16/2015  Decreased Interest 0 0 1  Down, Depressed, Hopeless 2 0 -  PHQ - 2 Score 2 0 1  Altered sleeping 0 - -  Tired, decreased energy 0 - -  Change in appetite 0 - -  Feeling bad or failure about yourself  1 - -  Trouble concentrating 0 - -  Moving slowly or fidgety/restless 0 - -  Suicidal thoughts 0 - -  PHQ-9 Score 3 - -     Relevant past medical, surgical, family and social history reviewed and updated as indicated. Interim medical history since our last visit reviewed. Allergies and medications reviewed and  updated.  Review of Systems  Constitutional: Negative.   HENT: Negative.   Respiratory: Negative.   Cardiovascular: Negative.     Per HPI unless specifically indicated above     Objective:    BP 112/72   Pulse 77   Temp 98.5 F (36.9 C) (Oral)   Ht 6' 2.1" (1.882 m)   Wt 147 lb 6.4 oz (66.9 kg)   SpO2 98%   BMI 18.87 kg/m   Wt Readings from Last 3 Encounters:  08/17/17 147 lb 6.4 oz (66.9 kg)  05/15/16 150 lb (68 kg)  12/06/15 137 lb 3.2 oz (62.2 kg)    Physical Exam  Constitutional: He is oriented to person, place, and time. He appears well-developed and well-nourished. No distress.  HENT:  Head: Normocephalic and atraumatic.  Eyes: Conjunctivae and lids are normal. Right eye exhibits no discharge. Left eye exhibits no discharge. No scleral icterus.  Neck: Normal range of motion. Neck supple. No JVD present. Carotid bruit is not present.  Cardiovascular: Normal rate, regular rhythm and normal heart sounds.  Pulmonary/Chest: Effort normal and breath sounds normal. No respiratory  distress.  Abdominal: Normal appearance. There is no splenomegaly or hepatomegaly.  Musculoskeletal: Normal range of motion.  Neurological: He is alert and oriented to person, place, and time.  Skin: Skin is warm, dry and intact. No rash noted. No pallor.  Psychiatric: He has a normal mood and affect. His behavior is normal. Judgment and thought content normal.    Results for orders placed or performed in visit on 12/06/15  HIV antibody  Result Value Ref Range   HIV Screen 4th Generation wRfx Non Reactive Non Reactive  Comprehensive metabolic panel  Result Value Ref Range   Glucose 61 (L) 65 - 99 mg/dL   BUN 11 6 - 20 mg/dL   Creatinine, Ser 4.091.15 0.76 - 1.27 mg/dL   GFR calc non Af Amer 83 >59 mL/min/1.73   GFR calc Af Amer 96 >59 mL/min/1.73   BUN/Creatinine Ratio 10 9 - 20   Sodium 142 134 - 144 mmol/L   Potassium 4.1 3.5 - 5.2 mmol/L   Chloride 97 96 - 106 mmol/L   CO2 26 18 - 29  mmol/L   Calcium 9.9 8.7 - 10.2 mg/dL   Total Protein 7.5 6.0 - 8.5 g/dL   Albumin 4.8 3.5 - 5.5 g/dL   Globulin, Total 2.7 1.5 - 4.5 g/dL   Albumin/Globulin Ratio 1.8 1.2 - 2.2   Bilirubin Total 0.6 0.0 - 1.2 mg/dL   Alkaline Phosphatase 158 (H) 39 - 117 IU/L   AST 16 0 - 40 IU/L   ALT 13 0 - 44 IU/L  CBC with Differential/Platelet  Result Value Ref Range   WBC 9.6 3.4 - 10.8 x10E3/uL   RBC 5.17 4.14 - 5.80 x10E6/uL   Hemoglobin 16.6 12.6 - 17.7 g/dL   Hematocrit 81.148.0 91.437.5 - 51.0 %   MCV 93 79 - 97 fL   MCH 32.1 26.6 - 33.0 pg   MCHC 34.6 31.5 - 35.7 g/dL   RDW 78.213.0 95.612.3 - 21.315.4 %   Platelets 240 150 - 379 x10E3/uL   Neutrophils 60 %   Lymphs 28 %   Monocytes 10 %   Eos 1 %   Basos 1 %   Neutrophils Absolute 5.8 1.4 - 7.0 x10E3/uL   Lymphocytes Absolute 2.7 0.7 - 3.1 x10E3/uL   Monocytes Absolute 0.9 0.1 - 0.9 x10E3/uL   EOS (ABSOLUTE) 0.1 0.0 - 0.4 x10E3/uL   Basophils Absolute 0.1 0.0 - 0.2 x10E3/uL   Immature Granulocytes 0 %   Immature Grans (Abs) 0.0 0.0 - 0.1 x10E3/uL  Lipid Panel w/o Chol/HDL Ratio  Result Value Ref Range   Cholesterol, Total 120 100 - 199 mg/dL   Triglycerides 60 0 - 149 mg/dL   HDL 44 >08>39 mg/dL   VLDL Cholesterol Cal 12 5 - 40 mg/dL   LDL Calculated 64 0 - 99 mg/dL  Dilantin (Phenytoin) level, total  Result Value Ref Range   Phenytoin (Dilantin), Serum 9.8 (L) 10.0 - 20.0 ug/mL      Assessment & Plan:   Problem List Items Addressed This Visit      Unprioritized   Depression    Mild.  Work on lifestyle changes.  Does not want medication at this time      Seizure disorder (HCC) - Primary    Poor control.  Restart Lyrica.  Continue Dilantin. Check Dilantin levels.  Refer to neurology as has not been seen for some time.        Relevant Medications   pregabalin (LYRICA) 200 MG capsule  Other Relevant Orders   Ambulatory referral to Neurology   Dilantin (Phenytoin) level, total   Comprehensive metabolic panel   CBC with  Differential/Platelet       Follow up plan: Return in about 4 weeks (around 09/14/2017) for physical.

## 2017-08-17 NOTE — Assessment & Plan Note (Addendum)
Poor control.  Restart Lyrica.  Continue Dilantin. Check Dilantin levels.  Refer to neurology as has not been seen for some time.

## 2017-08-17 NOTE — Assessment & Plan Note (Signed)
Mild.  Work on lifestyle changes.  Does not want medication at this time

## 2017-08-17 NOTE — Addendum Note (Signed)
Addended by: Gabriel CirriWICKER, Olla Delancey on: 08/17/2017 10:16 AM   Modules accepted: Orders

## 2017-08-18 ENCOUNTER — Telehealth: Payer: Self-pay | Admitting: Unknown Physician Specialty

## 2017-08-18 ENCOUNTER — Encounter: Payer: Self-pay | Admitting: Unknown Physician Specialty

## 2017-08-18 LAB — CBC WITH DIFFERENTIAL/PLATELET
BASOS ABS: 0.1 10*3/uL (ref 0.0–0.2)
Basos: 1 %
EOS (ABSOLUTE): 0.3 10*3/uL (ref 0.0–0.4)
Eos: 3 %
Hematocrit: 45.9 % (ref 37.5–51.0)
Hemoglobin: 16.1 g/dL (ref 13.0–17.7)
IMMATURE GRANS (ABS): 0 10*3/uL (ref 0.0–0.1)
IMMATURE GRANULOCYTES: 0 %
LYMPHS: 26 %
Lymphocytes Absolute: 2.4 10*3/uL (ref 0.7–3.1)
MCH: 32.5 pg (ref 26.6–33.0)
MCHC: 35.1 g/dL (ref 31.5–35.7)
MCV: 93 fL (ref 79–97)
Monocytes Absolute: 0.8 10*3/uL (ref 0.1–0.9)
Monocytes: 8 %
NEUTROS ABS: 5.7 10*3/uL (ref 1.4–7.0)
NEUTROS PCT: 62 %
PLATELETS: 288 10*3/uL (ref 150–379)
RBC: 4.95 x10E6/uL (ref 4.14–5.80)
RDW: 13.2 % (ref 12.3–15.4)
WBC: 9.3 10*3/uL (ref 3.4–10.8)

## 2017-08-18 LAB — COMPREHENSIVE METABOLIC PANEL
ALT: 17 IU/L (ref 0–44)
AST: 18 IU/L (ref 0–40)
Albumin/Globulin Ratio: 1.5 (ref 1.2–2.2)
Albumin: 4.2 g/dL (ref 3.5–5.5)
Alkaline Phosphatase: 129 IU/L — ABNORMAL HIGH (ref 39–117)
BILIRUBIN TOTAL: 0.2 mg/dL (ref 0.0–1.2)
BUN/Creatinine Ratio: 7 — ABNORMAL LOW (ref 9–20)
BUN: 7 mg/dL (ref 6–20)
CHLORIDE: 105 mmol/L (ref 96–106)
CO2: 26 mmol/L (ref 20–29)
Calcium: 9.5 mg/dL (ref 8.7–10.2)
Creatinine, Ser: 0.98 mg/dL (ref 0.76–1.27)
GFR calc Af Amer: 115 mL/min/{1.73_m2} (ref >59)
GFR calc non Af Amer: 99 mL/min/{1.73_m2} (ref >59)
GLUCOSE: 105 mg/dL — AB (ref 65–99)
Globulin, Total: 2.8 g/dL (ref 1.5–4.5)
POTASSIUM: 4 mmol/L (ref 3.5–5.2)
Sodium: 144 mmol/L (ref 134–144)
Total Protein: 7 g/dL (ref 6.0–8.5)

## 2017-08-18 LAB — PHENYTOIN LEVEL, TOTAL: PHENYTOIN (DILANTIN), SERUM: 9 ug/mL — AB (ref 10.0–20.0)

## 2017-08-18 NOTE — Telephone Encounter (Signed)
PA submitted and approved through [employer]. Will let patient know.

## 2017-08-18 NOTE — Telephone Encounter (Signed)
Patient notified that medication was approved.

## 2017-08-18 NOTE — Telephone Encounter (Signed)
Copied from CRM [phone]. Topic: Inquiry >> Aug 18, 2017 10:07 AM Anice PaganiniMunoz, [place] Strieter I, NT wrote: Reason for CRM: Pt call and said hi need Doctor Jamesetta OrleansWicker to send approve to his Insurances for Lyrica  200 mg because the pharmacy said it not cover bay his insurance.Thanks

## 2017-08-18 NOTE — Telephone Encounter (Signed)
Patient has medicaid.

## 2017-09-21 ENCOUNTER — Encounter: Payer: Self-pay | Admitting: Unknown Physician Specialty

## 2017-09-21 ENCOUNTER — Ambulatory Visit: Payer: Medicaid Other | Admitting: Unknown Physician Specialty

## 2017-09-21 VITALS — BP 131/79 | HR 67 | Temp 99.3°F | Wt 151.6 lb

## 2017-09-21 DIAGNOSIS — G40909 Epilepsy, unspecified, not intractable, without status epilepticus: Secondary | ICD-10-CM | POA: Diagnosis not present

## 2017-09-21 DIAGNOSIS — J0100 Acute maxillary sinusitis, unspecified: Secondary | ICD-10-CM

## 2017-09-21 DIAGNOSIS — G809 Cerebral palsy, unspecified: Secondary | ICD-10-CM | POA: Insufficient documentation

## 2017-09-21 DIAGNOSIS — G802 Spastic hemiplegic cerebral palsy: Secondary | ICD-10-CM | POA: Diagnosis not present

## 2017-09-21 DIAGNOSIS — Z0001 Encounter for general adult medical examination with abnormal findings: Secondary | ICD-10-CM

## 2017-09-21 DIAGNOSIS — F17200 Nicotine dependence, unspecified, uncomplicated: Secondary | ICD-10-CM | POA: Diagnosis not present

## 2017-09-21 MED ORDER — SULFAMETHOXAZOLE-TRIMETHOPRIM 800-160 MG PO TABS: 1.0000 | ORAL_TABLET | Freq: Two times a day (BID) | ORAL | 0 refills | Status: DC

## 2017-09-21 NOTE — Assessment & Plan Note (Signed)
Stable since birth

## 2017-09-21 NOTE — Assessment & Plan Note (Signed)
Stable on current medication.  Will continue.  Neurology appt pending

## 2017-09-21 NOTE — Assessment & Plan Note (Signed)
I have recommended absolute tobacco cessation. I have discussed various options available for assistance with tobacco cessation including over the counter methods (Nicotine gum, patch and lozenges). We also discussed prescription options (Chantix, Nicotine Inhaler / Nasal Spray). The patient is not interested in pursuing any prescription tobacco cessation options at this time.  

## 2017-09-21 NOTE — Progress Notes (Signed)
BP 131/79   Pulse 67   Temp 99.3 F (37.4 C) (Oral)   Wt 151 lb 9.6 oz (68.8 kg)   SpO2 98%   BMI 19.41 kg/m    Subjective:    Patient ID: Edward Welch, male    DOB: 05-19-1981, 37 y.o.   MRN: [id number]  HPI: Edward Welch is a 37 y.o. male  Chief Complaint  Patient presents with  . Annual Exam   Seizure disorder No more seizures since restarting Lyrica 4 weeks ago.  He has not yet been to see the neurologist.  This is scheduled  [facility] 09/21/2017 08/17/2017 12/06/2015 04/16/2015  Decreased Interest 0 0 0 1  Down, Depressed, Hopeless 1 2 0 -  PHQ - 2 Score 1 2 0 1  Altered sleeping - 0 - -  Tired, decreased energy 1 0 - -  Change in appetite 0 0 - -  Feeling bad or failure about yourself  2 1 - -  Trouble concentrating 0 0 - -  Moving slowly or fidgety/restless 1 0 - -  Suicidal thoughts 0 0 - -  PHQ-9 Score - 3 - -   Sinus Problem  This is a new problem. Episode onset: 6 weeks. The problem has been waxing and waning since onset. There has been no fever. Associated symptoms include congestion and coughing. Pertinent negatives include no chills, diaphoresis, ear pain, headaches, hoarse voice, neck pain, shortness of breath, sinus pressure, sneezing, sore throat or swollen glands. Past treatments include nothing.   Tobacco Smoking 1ppd.  Not interested in quitting  Social History   Socioeconomic History  . Marital status: Legally Separated    Spouse name: Not on file  . Number of children: Not on file  . Years of education: Not on file  . Highest education level: Not on file  Social Needs  . Financial resource strain: Not on file  . Food insecurity - worry: Not on file  . Food insecurity - inability: Not on file  . Transportation needs - medical: Not on file  . Transportation needs - non-medical: Not on file  Occupational History  . Not on file  Tobacco Use  . Smoking status: Current Every Day Smoker    Packs/day: 1.00    Types:  Cigarettes  . Smokeless tobacco: Former Engineer, waterUser  Substance and Sexual Activity  . Alcohol use: Yes    Alcohol/week: 0.0 oz    Comment: on occasion  . Drug use: Yes    Types: Marijuana  . Sexual activity: Yes  Other Topics Concern  . Not on file  Social History Narrative  . Not on file   Family History  Problem Relation Age of Onset  . Diabetes Mother   . Hypertension Father   . Seizures Sister   . Allergies Daughter   . Asthma Son   . Heart disease Maternal Grandmother    Past Medical History:  Diagnosis Date  . Anxiety   . Broken wrist   . Depression   . Seizures (HCC)    History reviewed. No pertinent surgical history.  Relevant past medical, surgical, family and social history reviewed and updated as indicated. Interim medical history since our last visit reviewed. Allergies and medications reviewed and updated.  Review of Systems  Constitutional: Negative.  Negative for chills and diaphoresis.  HENT: Positive for congestion. Negative for ear pain, hoarse voice, sinus pressure, sneezing and sore throat.   Eyes: Negative.   Respiratory: Positive  for cough. Negative for shortness of breath.   Cardiovascular: Negative.   Gastrointestinal: Negative.   Endocrine: Negative.   Genitourinary: Negative.   Musculoskeletal: Negative for neck pain.  Skin: Negative.   Allergic/Immunologic: Negative.   Neurological: Negative.  Negative for headaches.  Hematological: Negative.   Psychiatric/Behavioral: Negative.     Per HPI unless specifically indicated above     Objective:    BP 131/79   Pulse 67   Temp 99.3 F (37.4 C) (Oral)   Wt 151 lb 9.6 oz (68.8 kg)   SpO2 98%   BMI 19.41 kg/m   Wt Readings from Last 3 Encounters:  09/21/17 151 lb 9.6 oz (68.8 kg)  08/17/17 147 lb 6.4 oz (66.9 kg)  05/15/16 150 lb (68 kg)    Physical Exam  Constitutional: He is oriented to person, place, and time. He appears well-developed and well-nourished. No distress.  HENT:  Head:  Normocephalic and atraumatic.  Right Ear: Tympanic membrane, external ear and ear canal normal.  Left Ear: Tympanic membrane, external ear and ear canal normal.  Nose: Mucosal edema and sinus tenderness present. Right sinus exhibits maxillary sinus tenderness. Left sinus exhibits maxillary sinus tenderness.  Mouth/Throat: Uvula is midline, oropharynx is clear and moist and mucous membranes are normal.  Eyes: Conjunctivae and lids are normal. Pupils are equal, round, and reactive to light. Right eye exhibits no discharge. Left eye exhibits no discharge. No scleral icterus.  Cardiovascular: Normal rate, regular rhythm and normal heart sounds. Exam reveals no gallop and no friction rub.  No murmur heard. Pulmonary/Chest: Effort normal and breath sounds normal. No respiratory distress.  Abdominal: Soft. Normal appearance and bowel sounds are normal. He exhibits no distension. There is no splenomegaly or hepatomegaly. There is no tenderness.  Musculoskeletal: Normal range of motion.  Neurological: He is alert and oriented to person, place, and time. He has normal reflexes.  Skin: Skin is warm, dry and intact. No rash noted. No pallor.  Psychiatric: He has a normal mood and affect. His behavior is normal. Judgment and thought content normal.    Results for orders placed or performed in visit on 08/17/17  Dilantin (Phenytoin) level, total  Result Value Ref Range   Phenytoin (Dilantin), Serum 9.0 (L) 10.0 - 20.0 ug/mL  Comprehensive metabolic panel  Result Value Ref Range   Glucose 105 (H) 65 - 99 mg/dL   BUN 7 6 - 20 mg/dL   Creatinine, Ser 9.60 0.76 - 1.27 mg/dL   GFR calc non Af Amer 99 >59 mL/min/1.73   GFR calc Af Amer 115 >59 mL/min/1.73   BUN/Creatinine Ratio 7 (L) 9 - 20   Sodium 144 134 - 144 mmol/L   Potassium 4.0 3.5 - 5.2 mmol/L   Chloride 105 96 - 106 mmol/L   CO2 26 20 - 29 mmol/L   Calcium 9.5 8.7 - 10.2 mg/dL   Total Protein 7.0 6.0 - 8.5 g/dL   Albumin 4.2 3.5 - 5.5 g/dL    Globulin, Total 2.8 1.5 - 4.5 g/dL   Albumin/Globulin Ratio 1.5 1.2 - 2.2   Bilirubin Total 0.2 0.0 - 1.2 mg/dL   Alkaline Phosphatase 129 (H) 39 - 117 IU/L   AST 18 0 - 40 IU/L   ALT 17 0 - 44 IU/L  CBC with Differential/Platelet  Result Value Ref Range   WBC 9.3 3.4 - 10.8 x10E3/uL   RBC 4.95 4.14 - 5.80 x10E6/uL   Hemoglobin 16.1 13.0 - 17.7 g/dL   Hematocrit 45.9  37.5 - 51.0 %   MCV 93 79 - 97 fL   MCH 32.5 26.6 - 33.0 pg   MCHC 35.1 31.5 - 35.7 g/dL   RDW 16.1 09.6 - 04.5 %   Platelets 288 150 - 379 x10E3/uL   Neutrophils 62 Not Estab. %   Lymphs 26 Not Estab. %   Monocytes 8 Not Estab. %   Eos 3 Not Estab. %   Basos 1 Not Estab. %   Neutrophils Absolute 5.7 1.4 - 7.0 x10E3/uL   Lymphocytes Absolute 2.4 0.7 - 3.1 x10E3/uL   Monocytes Absolute 0.8 0.1 - 0.9 x10E3/uL   EOS (ABSOLUTE) 0.3 0.0 - 0.4 x10E3/uL   Basophils Absolute 0.1 0.0 - 0.2 x10E3/uL   Immature Granulocytes 0 Not Estab. %   Immature Grans (Abs) 0.0 0.0 - 0.1 x10E3/uL      Assessment & Plan:   Problem List Items Addressed This Visit      Unprioritized   Cerebral palsy (HCC)    Stable since birth      Seizure disorder (HCC)    Stable on current medication.  Will continue.  Neurology appt pending      Tobacco dependence     I have recommended absolute tobacco cessation. I have discussed various options available for assistance with tobacco cessation including over the counter methods (Nicotine gum, patch and lozenges). We also discussed prescription options (Chantix, Nicotine Inhaler / Nasal Spray). The patient is not interested in pursuing any prescription tobacco cessation options at this time.        Other Visit Diagnoses    Acute non-recurrent maxillary sinusitis    -  Primary   Rx for Bactrim.  Saline nasal sprays   Relevant Medications   sulfamethoxazole-trimethoprim (BACTRIM DS,SEPTRA DS) 800-160 MG tablet       Follow up plan: Return in about 6 months (around 03/21/2018).

## 2017-10-12 ENCOUNTER — Encounter: Payer: Self-pay | Admitting: Neurology

## 2017-10-12 ENCOUNTER — Ambulatory Visit: Payer: Medicaid Other | Admitting: Neurology

## 2017-10-12 VITALS — BP 118/84 | HR 76 | Ht 75.5 in | Wt 150.0 lb

## 2017-10-12 DIAGNOSIS — G40209 Localization-related (focal) (partial) symptomatic epilepsy and epileptic syndromes with complex partial seizures, not intractable, without status epilepticus: Secondary | ICD-10-CM | POA: Insufficient documentation

## 2017-10-12 MED ORDER — LAMOTRIGINE 25 MG PO TABS: ORAL_TABLET | ORAL | 0 refills | Status: DC

## 2017-10-12 MED ORDER — LAMOTRIGINE ER 300 MG PO TB24: 300.0000 mg | ORAL_TABLET | Freq: Every day | ORAL | 11 refills | Status: DC

## 2017-10-12 MED ORDER — LAMOTRIGINE 100 MG PO TABS: 100.0000 mg | ORAL_TABLET | Freq: Every day | ORAL | 11 refills | Status: DC

## 2017-10-12 NOTE — Patient Instructions (Signed)
weeks Dilantin ER 100mg  Lyrica 200mg  Lamotrigine 25mg   1 4 1  1/1  2 4 1  2/2  3 4  0 3/3  4 3   4/4  5 2   Lamotrigine ER 300mg  One tab every night  6 1  1  7  0  1

## 2017-10-12 NOTE — Progress Notes (Signed)
PATIENT: Edward FloresBrandon L Welch DOB: February 28, 1981  Chief Complaint  Patient presents with  . New Patient (Initial Visit)    PCP/Referring: Phineas Inchesheryl Wicker,NP. Had 2 seizure one month ago in the same day. Before that, pt had gone about one year without a seizure. Pt reports taking dilantin compliantly. Vision without correction: R: 20/40, L: 20/30, B: 20/30     HISTORICAL  Edward FloresBrandon L Welch is a 37 year old male, seen in refer by his nurse practitioner Gabriel CirriWicker, Cheryl for evaluation of seizure, initial evaluation was on October 12, 2017.  He is accompanied by his mother at today's clinical visit.   I reviewed and summarized the referring note, he was 2 weeks posteriorly, noted left side difficulty early on, tends to crawl with his right side, start to walk untill 37 years old, mild developmentally slow, had extensive physical therapy, occupational therapy in early childhood,  He has strong family history of epilepsy at his father's side, stepsister from his father, and paternal uncle suffered epilepsy.  He began to have seizure at age 37, no warning signs, generalized tonic-clonic seizure, has been treated with Dilantin 400 mg every since, he has seizure 3-4 times each year, in addition, he has frequent nocturnal urinary accident, wet his bed unexpectedly 2-3 times each week, but there was no signs of nocturnal seizure associated with it.  Most recent seizure was in February 2019, while he was taking Dilantin ER 100 mg 4 tablets every night, Lyrica 200 mg every night was added on, tolerated it well.  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 Amoxil [Amoxicillin]   . Penicillins     HOME MEDICATIONS: Current Outpatient Medications  Medication Sig Dispense Refill  . phenytoin (DILANTIN) 100 MG ER capsule Take 4 capsules (400 mg total) by mouth daily. 360 capsule 0  . pregabalin (LYRICA) 200 MG capsule Take 1 capsule (200 mg total) by mouth daily. 90 capsule 1  . sulfamethoxazole-trimethoprim (BACTRIM DS,SEPTRA DS) 800-160 MG tablet Take 1 tablet by mouth 2 (two) times daily. 20 tablet 0   No current facility-administered medications for this visit.     PAST MEDICAL HISTORY: Past Medical History:  Diagnosis Date  . Anxiety   . Broken wrist   . Depression   . Seizures (HCC)     PAST SURGICAL HISTORY: History reviewed. No pertinent surgical history.  FAMILY HISTORY: Family History  Problem Relation Age of Onset  . Diabetes Mother   . Hypertension Father   . Seizures Sister   . Allergies Daughter   . Asthma Son   . Heart disease Maternal Grandmother     SOCIAL HISTORY:  Social History   Socioeconomic History  . Marital status: Legally Separated    Spouse name: Not on file  . Number of children: Not on file  . Years of education: Not on file  . Highest education level: Not on file  Social Needs  . Financial resource strain: Not on file  . Food insecurity - worry: Not on file  .  Food insecurity - inability: Not on file  . Transportation needs - medical: Not on file  . Transportation needs - non-medical: Not on file  Occupational History  . Not on file  Tobacco Use  . Smoking status: Current Every Day Smoker    Packs/day: 1.00    Types: Cigarettes  . Smokeless tobacco: Former Engineer, water and Sexual Activity  . Alcohol use: Yes    Alcohol/week: 0.0 oz    Comment: on occasion  . Drug use: Yes    Types: Marijuana  . Sexual  activity: Yes  Other Topics Concern  . Not on file  Social History Narrative  . Not on file     PHYSICAL EXAM   Vitals:   10/12/17 1027  BP: 118/84  Pulse: 76  Weight: 150 lb (68 kg)  Height: 6' 3.5" (1.918 m)    Not recorded      Body mass index is 18.5 kg/m.  PHYSICAL EXAMNIATION:  Gen: NAD, conversant, well nourised, obese, well groomed                     Cardiovascular: Regular rate rhythm, no peripheral edema, warm, nontender. Eyes: Conjunctivae clear without exudates or hemorrhage Neck: Supple, no carotid bruits. Pulmonary: Clear to auscultation bilaterally   NEUROLOGICAL EXAM:  MENTAL STATUS: Speech:    Speech is normal; fluent and spontaneous with normal comprehension.  Cognition:     Orientation to time, place and person     Normal recent and remote memory     Normal Attention span and concentration     Normal Language, naming, repeating,spontaneous speech     Fund of knowledge   CRANIAL NERVES: CN II: Visual fields are full to confrontation. Fundoscopic exam is normal with sharp discs and no vascular changes. Pupils are round equal and briskly reactive to light. CN III, IV, VI: extraocular movement are normal. No ptosis. CN V: Facial sensation is intact to pinprick in all 3 divisions bilaterally. Corneal responses are intact.  CN VII: Face is symmetric with normal eye closure and smile. CN VIII: Hearing is normal to rubbing fingers CN IX, X: Palate elevates symmetrically. Phonation is normal. CN XI: Head turning and shoulder shrug are intact CN XII: Tongue is midline with normal movements and no atrophy.  MOTOR: Hypoplasia of left arm and left leg, mild fixation of left upper extremity and rapid rotating movement, mild left arm and leg proximal and distal weakness, 5- out of 5  REFLEXES: Hyperreflexia on the left side  SENSORY: Intact to light touch, pinprick, positional sensation and vibratory sensation are intact in fingers and  toes.  COORDINATION: Rapid alternating movements and fine finger movements are intact. There is no dysmetria on finger-to-nose and heel-knee-shin.    GAIT/STANCE: He needs pushed up to get up from seated position, dragging his left leg some, mild distention feature of left hand  Romberg is absent.   DIAGNOSTIC DATA (LABS, IMAGING, TESTING) - I reviewed patient records, labs, notes, testing and imaging myself where available.   ASSESSMENT AND PLAN  ESTEVEN OVERFELT is a 37 y.o. male    Cerebral palsy, evidence of right encephalomalacia on MRI of the brain, Seizure,  Most likely complex partial seizure with quick secondary generalization  His frequent nocturnal urinary accident also due to partial seizure  He has developed side effect of worsening unsteady gait, evidence of cerebellar atrophy, gingival hypertrophy with long-term Dilantin treatment, and also suboptimal control of his seizure disorder.  Will change him to lamotrigine titrating to lamotrigine ER 300 mg every night  EEG   Document all events  Levert Feinstein, M.D. Ph.D.  [facility] [address], [address] [place], [place] [address] Ph: [phone] Fax: [phone]  CC: Gabriel Cirri, NP

## 2017-11-08 ENCOUNTER — Other Ambulatory Visit: Payer: Self-pay | Admitting: Unknown Physician Specialty

## 2017-11-09 NOTE — Telephone Encounter (Signed)
Dilantin refill Last OV: 09/21/17 Last Refill:08/13/17 #360 no RF Pharmacy[place] [id number] [place] PCP: Gabriel Cirriheryl Wicker NP

## 2017-11-16 ENCOUNTER — Other Ambulatory Visit: Payer: Medicaid Other

## 2017-11-17 ENCOUNTER — Encounter: Payer: Self-pay | Admitting: Neurology

## 2017-12-21 ENCOUNTER — Ambulatory Visit: Payer: Medicaid Other | Admitting: Neurology

## 2017-12-21 DIAGNOSIS — G40209 Localization-related (focal) (partial) symptomatic epilepsy and epileptic syndromes with complex partial seizures, not intractable, without status epilepticus: Secondary | ICD-10-CM

## 2017-12-24 NOTE — Procedures (Signed)
   HISTORY: 37 years old male, with history of seizure  TECHNIQUE:  16 channel EEG was performed based on standard 10-16 international system. One channel was dedicated to EKG, which has demonstrates normal sinus rhythm of 78 beats per minutes.  Upon awakening, the posterior background activity was well-developed, in alpha range,  reactive to eye opening and closure.  There was intermittent slowing involving T4, T6, F8 leads, higher amplitude, dyarrhythmic, in the theta range, with frequent sharp transients at T4.  Photic stimulation was performed, there was increase in frequency of T4 sharp transient,  Hyperventilation was not performed.  No sleep was achieved.  CONCLUSION: This is a mild abnormal EEG.  There is evidence of irritability involving right frontotemporal region, with intermittent slowing at F8, T4, T6, occasionally T4 sharp transient.  Levert Feinstein, M.D. Ph.D.  [facility] [address] [place], [place] [address] Phone: [phone] Fax:      [phone]

## 2018-01-04 ENCOUNTER — Telehealth: Payer: Self-pay | Admitting: Neurology

## 2018-01-04 NOTE — Telephone Encounter (Signed)
Pt requesting a call to touch base on his EEG. Please call to advise

## 2018-01-04 NOTE — Telephone Encounter (Signed)
Spoke to his mother, [name], on [place].  She is aware of results and verbalized understanding.  He will continue his medication and keep his follow up with Dr. Terrace Arabia.

## 2018-01-04 NOTE — Telephone Encounter (Signed)
Please call patient, CT head showed irritability of right side of brain, consistent with the imaging findings of right hemisphere chronic encephalomalacia (scar).

## 2018-01-18 ENCOUNTER — Ambulatory Visit: Payer: Medicaid Other | Admitting: Neurology

## 2018-02-17 ENCOUNTER — Ambulatory Visit: Payer: Medicaid Other | Admitting: Neurology

## 2018-02-17 ENCOUNTER — Encounter: Payer: Self-pay | Admitting: Neurology

## 2018-02-17 VITALS — BP 120/73 | HR 69 | Ht 75.5 in | Wt 146.5 lb

## 2018-02-17 DIAGNOSIS — G40209 Localization-related (focal) (partial) symptomatic epilepsy and epileptic syndromes with complex partial seizures, not intractable, without status epilepticus: Secondary | ICD-10-CM | POA: Diagnosis not present

## 2018-02-17 MED ORDER — LAMOTRIGINE ER 300 MG PO TB24: 300.0000 mg | ORAL_TABLET | Freq: Every day | ORAL | 4 refills | Status: DC

## 2018-02-17 NOTE — Progress Notes (Signed)
PATIENT: Edward Welch DOB: [birth year]  Chief Complaint  Patient presents with  . Seizures    He is here with his mother, Edward Welch.  Overall, feels he is doing well on lamotrigine ER 300mg  at bedtime.  He had one seizure while transitioning off Dilantin to Lamictal.  They would like to review his EEG results.     HISTORICAL  Edward Welch is a 37 year old male, seen in refer by his nurse practitioner Gabriel Cirri for evaluation of seizure, initial evaluation was on October 12, 2017.  He is accompanied by his mother at today's clinical visit.   I reviewed and summarized the referring note, he was 2 weeks premature,  noted left side difficulty early on, tends to crawl with his right side, start to walk untill 37 years old, mild developmentally slow, had extensive physical therapy, occupational therapy in early childhood,  He has strong family history of epilepsy at his father's side, stepsister from his father, and paternal uncle suffered epilepsy.  He began to have seizure at age 66, no warning signs, generalized tonic-clonic seizure, has been treated with Dilantin 400 mg every since, he has seizure 3-4 times each year, in addition, he has frequent nocturnal urinary accident, wet his bed unexpectedly 2-3 times each week, but there was no signs of nocturnal seizure associated with it.  Most recent seizure was in February 2019, while he was taking Dilantin ER 100 mg 4 tablets every night, Lyrica 200 mg every night was added on, tolerated it well.  He is on disability, lives at home with his mother, enjoying watching TV  CT head in 2015, MRI of the brain in 2007, right hemispheric encephalomalacia involving right frontal, parietal, temporal lobe, with overlying arachnoid cyst, mild to moderate cerebellar atrophy  Laboratory evaluation January 2019, normal CBC, CMP showed mild elevated glucose 105, mild elevated alkaline phosphate 129, Dilantin level was 9  UPDATE February 17 2018:  He had recurrent seizure in May 2019, when he was transitioned from Dilantin/Lyrica to lamotrigine, now he takes lamotrigine ER 300 mg every night, doing very well, he no longer has enuresis accident,  EEG on Dec 21 2017:   There is evidence of irritability involving right frontotemporal region, with intermittent slowing at F8, T4, T6, occasionally T4 sharp transient.  REVIEW OF SYSTEMS: Full 14 system review of systems performed and notable only for seizure, urination problems  ALLERGIES: Allergies  Allergen Reactions  . Amoxil [Amoxicillin]   . Penicillins     HOME MEDICATIONS: Current Outpatient Medications  Medication Sig Dispense Refill  . LamoTRIgine 300 MG TB24 24 hour tablet Take 1 tablet (300 mg total) by mouth at bedtime. 30 tablet 11   No current facility-administered medications for this visit.     PAST MEDICAL HISTORY: Past Medical History:  Diagnosis Date  . Anxiety   . Broken wrist   . Depression   . Seizures (HCC)     PAST SURGICAL HISTORY: History reviewed. No pertinent surgical history.  FAMILY HISTORY: Family History  Problem Relation Age of Onset  . Diabetes Mother   . Hypertension Father   . Seizures Sister   . Allergies Daughter   . Asthma Son   . Heart disease Maternal Grandmother     SOCIAL HISTORY:  Social History   Socioeconomic History  . Marital status: Legally Separated    Spouse name: Not on file  . Number of children: Not on file  . Years of education: Not on  file  . Highest education level: Not on file  Occupational History  . Not on file  Social Needs  . Financial resource strain: Not on file  . Food insecurity:    Worry: Not on file    Inability: Not on file  . Transportation needs:    Medical: Not on file    Non-medical: Not on file  Tobacco Use  . Smoking status: Current Every Day Smoker    Packs/day: 1.00    Types: Cigarettes  . Smokeless tobacco: Former Engineer, water and Sexual Activity  . Alcohol  use: Yes    Alcohol/week: 0.0 oz    Comment: on occasion  . Drug use: Yes    Types: Marijuana  . Sexual activity: Yes  Lifestyle  . Physical activity:    Days per week: Not on file    Minutes per session: Not on file  . Stress: Not on file  Relationships  . Social connections:    Talks on phone: Not on file    Gets together: Not on file    Attends religious service: Not on file    Active member of club or organization: Not on file    Attends meetings of clubs or organizations: Not on file    Relationship status: Not on file  . Intimate partner violence:    Fear of current or ex partner: Not on file    Emotionally abused: Not on file    Physically abused: Not on file    Forced sexual activity: Not on file  Other Topics Concern  . Not on file  Social History Narrative  . Not on file     PHYSICAL EXAM   Vitals:   02/17/18 1109  BP: 120/73  Pulse: 69  Weight: 146 lb 8 oz (66.5 kg)  Height: 6' 3.5" (1.918 m)    Not recorded      Body mass index is 18.07 kg/m.  PHYSICAL EXAMNIATION:  Gen: NAD, conversant, well nourised, obese, well groomed                     Cardiovascular: Regular rate rhythm, no peripheral edema, warm, nontender. Eyes: Conjunctivae clear without exudates or hemorrhage Neck: Supple, no carotid bruits. Pulmonary: Clear to auscultation bilaterally   NEUROLOGICAL EXAM:  MENTAL STATUS: Speech:    Speech is normal; fluent and spontaneous with normal comprehension.  Cognition:     Orientation to time, place and person     Normal recent and remote memory     Normal Attention span and concentration     Normal Language, naming, repeating,spontaneous speech     Fund of knowledge   CRANIAL NERVES: CN II: Visual fields are full to confrontation. Fundoscopic exam is normal with sharp discs and no vascular changes. Pupils are round equal and briskly reactive to light. CN III, IV, VI: extraocular movement are normal. No ptosis. CN V: Facial  sensation is intact to pinprick in all 3 divisions bilaterally. Corneal responses are intact.  CN VII: Face is symmetric with normal eye closure and smile. CN VIII: Hearing is normal to rubbing fingers CN IX, X: Palate elevates symmetrically. Phonation is normal. CN XI: Head turning and shoulder shrug are intact CN XII: Tongue is midline with normal movements and no atrophy.  MOTOR: Hypoplasia of left arm and left leg, mild fixation of left upper extremity and rapid rotating movement, mild left arm and leg proximal and distal weakness, 5- out of 5  REFLEXES: Hyperreflexia  on the left side  SENSORY: Intact to light touch, pinprick, positional sensation and vibratory sensation are intact in fingers and toes.  COORDINATION: Rapid alternating movements and fine finger movements are intact. There is no dysmetria on finger-to-nose and heel-knee-shin.    GAIT/STANCE: He needs pushed up to get up from seated position, dragging his left leg some, mild dystonic feature of left hand   Romberg is absent.   DIAGNOSTIC DATA (LABS, IMAGING, TESTING) - I reviewed patient records, labs, notes, testing and imaging myself where available.   ASSESSMENT AND PLAN  Joan FloresBrandon L Londo is a 37 y.o. male    Cerebral palsy, evidence of right encephalomalacia on MRI of the brain, Seizure,  Most likely complex partial seizure with quick secondary generalization  His frequent enuresis could also due to partial seizure, which has much improved since he take lamotrigine ER 300 mg every night no significant side effect noted.      Levert FeinsteinYijun Britta Louth, M.D. Ph.D.  [facility] [address], [address] [place], [place] [address] Ph: [phone] Fax: [phone]  CC: Gabriel CirriWicker, Cheryl, NP

## 2018-02-23 ENCOUNTER — Encounter: Payer: Self-pay | Admitting: Unknown Physician Specialty

## 2018-03-22 ENCOUNTER — Ambulatory Visit: Payer: Self-pay | Admitting: Unknown Physician Specialty

## 2018-03-22 ENCOUNTER — Encounter: Payer: Self-pay | Admitting: Physician Assistant

## 2018-03-22 ENCOUNTER — Encounter (INDEPENDENT_AMBULATORY_CARE_PROVIDER_SITE_OTHER): Payer: Medicaid Other | Admitting: Physician Assistant

## 2018-03-22 ENCOUNTER — Other Ambulatory Visit: Payer: Self-pay

## 2018-03-22 ENCOUNTER — Encounter: Payer: Self-pay | Admitting: Nurse Practitioner

## 2018-03-22 NOTE — Progress Notes (Signed)
   Subjective:   Note opened in error.

## 2018-09-20 ENCOUNTER — Encounter: Payer: Self-pay | Admitting: Neurology

## 2018-09-20 ENCOUNTER — Ambulatory Visit: Payer: Medicaid Other | Admitting: Neurology

## 2018-09-20 ENCOUNTER — Ambulatory Visit: Payer: Medicaid Other | Admitting: Nurse Practitioner

## 2018-09-20 VITALS — BP 106/74 | HR 78 | Ht 74.5 in | Wt 150.0 lb

## 2018-09-20 DIAGNOSIS — G40909 Epilepsy, unspecified, not intractable, without status epilepticus: Secondary | ICD-10-CM

## 2018-09-20 MED ORDER — LAMOTRIGINE ER 200 MG PO TB24: 400.0000 mg | ORAL_TABLET | Freq: Once | ORAL | 4 refills | Status: DC

## 2018-09-20 NOTE — Progress Notes (Signed)
PATIENT: Edward Welch DOB: 05-Mar-1981  REASON FOR VISIT: follow up HISTORY FROM: patient  HISTORY OF PRESENT ILLNESS: HISTORY Edward Welch is a 38 year old male, seen in refer by his nurse practitioner Gabriel CirriWicker, Cheryl for evaluation of seizure, initial evaluation was on October 12, 2017.  He is accompanied by his mother at today's clinical visit.   I reviewed and summarized the referring note, he was 2 weeks premature,  noted left side difficulty early on, tends to crawl with his right side, start to walk untill 38 years old, mild developmentally slow, had extensive physical therapy, occupational therapy in early childhood,  He has strong family history of epilepsy at his father's side, stepsister from his father, and paternal uncle suffered epilepsy.  He began to have seizure at age 38, no warning signs, generalized tonic-clonic seizure, has been treated with Dilantin 400 mg every since, he has seizure 3-4 times each year, in addition, he has frequent nocturnal urinary accident, wet his bed unexpectedly 2-3 times each week, but there was no signs of nocturnal seizure associated with it.  Most recent seizure was in February 2019, while he was taking Dilantin ER 100 mg 4 tablets every night, Lyrica 200 mg every night was added on, tolerated it well.  He is on disability, lives at home with his mother, enjoying watching TV  CT head in 2015, MRI of the brain in 2007, right hemispheric encephalomalacia involving right frontal, parietal, temporal lobe, with overlying arachnoid cyst, mild to moderate cerebellar atrophy  Laboratory evaluation January 2019, normal CBC, CMP showed mild elevated glucose 105, mild elevated alkaline phosphate 129, Dilantin level was 9  UPDATE February 17 2018:  He had recurrent seizure in May 2019, when he was transitioned from Dilantin/Lyrica to lamotrigine, now he takes lamotrigine ER 300 mg every night, doing very well, he no longer has enuresis  accident,  EEG on Dec 21 2017:   There is evidence of irritability involving right frontotemporal region, with intermittent slowing at F8, T4, T6, occasionally T4 sharp transient.  UPDATE September 20, 2018:  Edward Welch is a 38 year old male who presents for follow-up a long-standing history of seizures since he was 38 years old. He is currently taking lamotrigine 300 mg 24-hr tablet. He is tolerating the medication well. He reports a grand mal seizure 5 weeks ago, lasted 60 seconds, witnessed by his mom. The seizure occurred when he was getting out of bed. His mom heard him shaking. He took 5 minutes to come around, the he was able to talk with his mom. He did bite his tongue during the seizure.  He believes it was stress-related, and as result of sleep deprivation. Sometimes he doesn't sleep well at night because he isn't tired. Prior to this event, his last seizure was Feb 2019 when he was converted from Dilantin to Lamotrigine. He reports stress related to his ex-wife and kids as a seizure trigger. He currently lives with his mom and is on disability. In his free time, he hangs out with friends and watches TV. He no longer drives a car. He denies problems with his gait, rash, or any falls. He presents for follow-up accompanied by his mom today.   REVIEW OF SYSTEMS: Out of a complete 14 system review of symptoms, the patient complains only of the following symptoms, and all other reviewed systems are negative.  Runny nose, eye itching, excessive thirst, seizure    ALLERGIES: Allergies  Allergen Reactions  . Amoxil [Amoxicillin]   .  Penicillins     HOME MEDICATIONS: Outpatient Medications Prior to Visit  Medication Sig Dispense Refill  . LamoTRIgine 300 MG TB24 24 hour tablet Take 1 tablet (300 mg total) by mouth at bedtime. 90 tablet 4   No facility-administered medications prior to visit.     PAST MEDICAL HISTORY: Past Medical History:  Diagnosis Date  . Anxiety   . Broken wrist    . Depression   . Seizures (HCC)     PAST SURGICAL HISTORY: No past surgical history on file.  FAMILY HISTORY: Family History  Problem Relation Age of Onset  . Diabetes Mother   . Hypertension Father   . Seizures Sister   . Allergies Daughter   . Asthma Son   . Heart disease Maternal Grandmother     SOCIAL HISTORY: Social History   Socioeconomic History  . Marital status: Legally Separated    Spouse name: Not on file  . Number of children: Not on file  . Years of education: Not on file  . Highest education level: Not on file  Occupational History  . Not on file  Social Needs  . Financial resource strain: Not on file  . Food insecurity:    Worry: Not on file    Inability: Not on file  . Transportation needs:    Medical: Not on file    Non-medical: Not on file  Tobacco Use  . Smoking status: Current Every Day Smoker    Packs/day: 0.50    Types: Cigarettes  . Smokeless tobacco: Former Engineer, water and Sexual Activity  . Alcohol use: Yes    Alcohol/week: 0.0 standard drinks    Comment: on occasion  . Drug use: Yes    Types: Marijuana  . Sexual activity: Yes  Lifestyle  . Physical activity:    Days per week: Not on file    Minutes per session: Not on file  . Stress: Not on file  Relationships  . Social connections:    Talks on phone: Not on file    Gets together: Not on file    Attends religious service: Not on file    Active member of club or organization: Not on file    Attends meetings of clubs or organizations: Not on file    Relationship status: Not on file  . Intimate partner violence:    Fear of current or ex partner: Not on file    Emotionally abused: Not on file    Physically abused: Not on file    Forced sexual activity: Not on file  Other Topics Concern  . Not on file  Social History Narrative  . Not on file      PHYSICAL EXAM  Vitals:   09/20/18 1043  BP: 106/74  Pulse: 78  Weight: 150 lb (68 kg)  Height: 6' 2.5" (1.892 m)     Body mass index is 19 kg/m.  Generalized: Well developed, in no acute distress   Neurological examination  Mentation: Alert oriented to time, place, history taking. Follows all commands speech and language fluent Cranial nerve II-XII: Pupils were equal round reactive to light. Extraocular movements were full, visual field were full on confrontational test. Facial sensation and strength were normal. Uvula tongue midline. Head turning and shoulder shrug  were normal and symmetric. Motor: The motor testing reveals 5 over 5 strength of all 4 extremities, but mild weakness to his left arm and left leg. Good symmetric motor tone is noted throughout.  Sensory: Sensory testing  is intact to soft touch on all 4 extremities. No evidence of extinction is noted.  Coordination: Cerebellar testing reveals good finger-nose-finger and heel-to-shin bilaterally.  Gait and station: Gait is normal. Tandem gait is mildly unsteady. Romberg is negative. No drift is seen.  Reflexes: Deep tendon reflexes are symmetric and normal bilaterally.   DIAGNOSTIC DATA (LABS, IMAGING, TESTING) - I reviewed patient records, labs, notes, testing and imaging myself where available.  Lab Results  Component Value Date   WBC 9.3 08/17/2017   HGB 16.1 08/17/2017   HCT 45.9 08/17/2017   MCV 93 08/17/2017   PLT 288 08/17/2017      Component Value Date/Time   NA 144 08/17/2017 1028   K 4.0 08/17/2017 1028   CL 105 08/17/2017 1028   CO2 26 08/17/2017 1028   GLUCOSE 105 (H) 08/17/2017 1028   BUN 7 08/17/2017 1028   CREATININE 0.98 08/17/2017 1028   CALCIUM 9.5 08/17/2017 1028   PROT 7.0 08/17/2017 1028   ALBUMIN 4.2 08/17/2017 1028   AST 18 08/17/2017 1028   ALT 17 08/17/2017 1028   ALKPHOS 129 (H) 08/17/2017 1028   BILITOT 0.2 08/17/2017 1028   GFRNONAA 99 08/17/2017 1028   GFRAA 115 08/17/2017 1028   Lab Results  Component Value Date   CHOL 120 12/06/2015   HDL 44 12/06/2015   LDLCALC 64 12/06/2015   TRIG 60  12/06/2015   No results found for: HGBA1C No results found for: VITAMINB12 No results found for: TSH    ASSESSMENT AND PLAN 38 y.o. year old male  has a past medical history of Anxiety, Broken wrist, Depression, and Seizures (HCC). here with:  1.  Seizures, long standing history   He reports a seizure 5 weeks ago.  His mom witnessed the seizure and describes it as a grand mal seizure.  He is currently taking 300 mg of lamotrigine 24-hour tablet.  He denies missing any doses of his medication.  He likes the lamotrigine and he is tolerating it well.  He has a longstanding history of seizures with an abnormal EEG.  I discussed this with Dr. Terrace ArabiaYan and we will increase his lamotrigine to 400 mg 24-hour tablet.  I will check a lamotrigine level today along with a CBC and a CMP.  He does not drive a car however I discussed that he should not drive until 6 months of being seizure-free.  We discussed that he should avoid seizure triggers such as stress and sleep deprivation.  He will follow-up in 3 months or sooner if needed.  He is to call our office if his symptoms worsen or if he develops any new symptoms.   I spent 15 minutes with the patient. 50% of this time was spent discussing his plan of care and medication adjustment.   Margie EgeSarah Saron Tweed, AGNP-C, DNP 09/20/2018, 11:31 AM [facility] [address], [address] [place], [place] [address] [phone]

## 2018-09-22 LAB — CBC WITH DIFFERENTIAL/PLATELET
BASOS ABS: 0.1 10*3/uL (ref 0.0–0.2)
Basos: 1 %
EOS (ABSOLUTE): 0.7 10*3/uL — ABNORMAL HIGH (ref 0.0–0.4)
Eos: 7 %
Hematocrit: 45.2 % (ref 37.5–51.0)
Hemoglobin: 15.7 g/dL (ref 13.0–17.7)
IMMATURE GRANULOCYTES: 0 %
Immature Grans (Abs): 0 10*3/uL (ref 0.0–0.1)
LYMPHS ABS: 3.4 10*3/uL — AB (ref 0.7–3.1)
Lymphs: 35 %
MCH: 32.3 pg (ref 26.6–33.0)
MCHC: 34.7 g/dL (ref 31.5–35.7)
MCV: 93 fL (ref 79–97)
MONOS ABS: 0.8 10*3/uL (ref 0.1–0.9)
Monocytes: 8 %
NEUTROS PCT: 49 %
Neutrophils Absolute: 4.6 10*3/uL (ref 1.4–7.0)
Platelets: 255 10*3/uL (ref 150–450)
RBC: 4.86 x10E6/uL (ref 4.14–5.80)
RDW: 11.8 % (ref 11.6–15.4)
WBC: 9.6 10*3/uL (ref 3.4–10.8)

## 2018-09-22 LAB — COMPREHENSIVE METABOLIC PANEL
ALT: 14 IU/L (ref 0–44)
AST: 13 IU/L (ref 0–40)
Albumin/Globulin Ratio: 1.9 (ref 1.2–2.2)
Albumin: 4.8 g/dL (ref 4.0–5.0)
Alkaline Phosphatase: 107 IU/L (ref 39–117)
BUN/Creatinine Ratio: 7 — ABNORMAL LOW (ref 9–20)
BUN: 7 mg/dL (ref 6–20)
Bilirubin Total: 0.4 mg/dL (ref 0.0–1.2)
CO2: 21 mmol/L (ref 20–29)
CREATININE: 1.01 mg/dL (ref 0.76–1.27)
Calcium: 10.1 mg/dL (ref 8.7–10.2)
Chloride: 102 mmol/L (ref 96–106)
GFR calc Af Amer: 110 mL/min/{1.73_m2} (ref >59)
GFR calc non Af Amer: 95 mL/min/{1.73_m2} (ref >59)
Globulin, Total: 2.5 g/dL (ref 1.5–4.5)
Glucose: 74 mg/dL (ref 65–99)
Potassium: 4.4 mmol/L (ref 3.5–5.2)
Sodium: 145 mmol/L — ABNORMAL HIGH (ref 134–144)
Total Protein: 7.3 g/dL (ref 6.0–8.5)

## 2018-09-22 LAB — LAMOTRIGINE LEVEL: LAMOTRIGINE LVL: 7.8 ug/mL (ref 2.0–20.0)

## 2018-09-22 NOTE — Progress Notes (Signed)
I have reviewed and agreed above plan. 

## 2018-09-23 ENCOUNTER — Encounter: Payer: Self-pay | Admitting: Nurse Practitioner

## 2018-09-27 ENCOUNTER — Encounter: Payer: Self-pay | Admitting: Nurse Practitioner

## 2018-09-27 ENCOUNTER — Other Ambulatory Visit: Payer: Self-pay

## 2018-09-27 ENCOUNTER — Ambulatory Visit (INDEPENDENT_AMBULATORY_CARE_PROVIDER_SITE_OTHER): Payer: Medicaid Other | Admitting: Nurse Practitioner

## 2018-09-27 VITALS — BP 135/86 | HR 78 | Temp 98.2°F | Ht 74.41 in | Wt 150.0 lb

## 2018-09-27 DIAGNOSIS — F341 Dysthymic disorder: Secondary | ICD-10-CM

## 2018-09-27 DIAGNOSIS — G40909 Epilepsy, unspecified, not intractable, without status epilepticus: Secondary | ICD-10-CM | POA: Diagnosis not present

## 2018-09-27 DIAGNOSIS — Z0000 Encounter for general adult medical examination without abnormal findings: Secondary | ICD-10-CM

## 2018-09-27 DIAGNOSIS — Z23 Encounter for immunization: Secondary | ICD-10-CM | POA: Diagnosis not present

## 2018-09-27 DIAGNOSIS — J019 Acute sinusitis, unspecified: Secondary | ICD-10-CM | POA: Diagnosis not present

## 2018-09-27 DIAGNOSIS — G802 Spastic hemiplegic cerebral palsy: Secondary | ICD-10-CM | POA: Diagnosis not present

## 2018-09-27 DIAGNOSIS — B9689 Other specified bacterial agents as the cause of diseases classified elsewhere: Secondary | ICD-10-CM | POA: Insufficient documentation

## 2018-09-27 MED ORDER — FLUTICASONE PROPIONATE 50 MCG/ACT NA SUSP: 2.0000 | Freq: Every day | NASAL | 6 refills | Status: DC

## 2018-09-27 MED ORDER — DOXYCYCLINE HYCLATE 100 MG PO TABS: 100.0000 mg | ORAL_TABLET | Freq: Two times a day (BID) | ORAL | 0 refills | Status: AC

## 2018-09-27 NOTE — Assessment & Plan Note (Signed)
Chronic, ongoing.  No recent seizure activity.  He is followed by [facility] and had visit 09/20/2018.  Continue to collaborate with neurology.

## 2018-09-27 NOTE — Assessment & Plan Note (Signed)
PHQ9 = 0 today.  He denies depressed mood.  Continue to monitor.

## 2018-09-27 NOTE — Assessment & Plan Note (Signed)
Doxycycline and fluticasone prescribed. Patient instructed to complete full course of antibiotics. Return if patients worsen or fail to improve.

## 2018-09-27 NOTE — Assessment & Plan Note (Signed)
Chronic, stable and independent in all ADL's.  Followed by [facility].

## 2018-09-27 NOTE — Progress Notes (Signed)
BP 135/86 (BP Location: Left Arm, Patient Position: Sitting, Cuff Size: Normal)   Pulse 78   Temp 98.2 F (36.8 C) (Oral)   Ht 6' 2.41" (1.89 m)   Wt 150 lb (68 kg)   SpO2 97%   BMI 19.05 kg/m    Subjective:    Patient ID: Edward Welch, male    DOB: [birth year], 38 y.o.   MRN: [id number]  HPI: Edward Welch is a 38 y.o. male presenting on 09/27/2018 for comprehensive medical examination. Current medical complaints include: frontal sinus pain and pressure with headache and nasal drainage for about 2 weeks.   He currently lives with: mother Interim Problems from his last visit: no  SINUSITIS: Two weeks of frontal sinus pain R>L with headache, tooth pain, watery eyes, nasal drainage, and a congestion. Pt denies fever, chills, or body aches. He denies a history of allergies. Pt has tried Tylenol, "nasal spray", and "an antibiotic I had at home" with no relief. Pt reports he took 3 doses of the antibiotic (pt unable to recall the name of the medication).   SEIZURE DISORDER AND CEREBRAL PALSY: [facility] and was last seen by Margie EgeSarah Slack NP on 09/20/2018.  At that visit they increased his Lamotrigine to 400 MG 24 hour tablet daily and performed Lamotrigine level + CBC and CMP.  He reports no seizure activity for approx one year.     Functional Status Survey: Is the patient deaf or have difficulty hearing?: No Does the patient have difficulty seeing, even when wearing glasses/contacts?: No Does the patient have difficulty concentrating, remembering, or making decisions?: No Does the patient have difficulty walking or climbing stairs?: No Does the patient have difficulty dressing or bathing?: No Does the patient have difficulty doing errands alone such as visiting a doctor's office or shopping?: No  FALL RISK: Fall Risk  12/06/2015  Falls in the past year? No    Depression Screen [facility] screen [facility] 2[facility] 09/21/2017 08/17/2017 12/06/2015 04/16/2015    Decreased Interest 0 0 0 1  Down, Depressed, Hopeless 1 2 0 -  PHQ - 2 Score 1 2 0 1  Altered sleeping - 0 - -  Tired, decreased energy 1 0 - -  Change in appetite 0 0 - -  Feeling bad or failure about yourself  2 1 - -  Trouble concentrating 0 0 - -  Moving slowly or fidgety/restless 1 0 - -  Suicidal thoughts 0 0 - -  PHQ-9 Score - 3 - -    Past Medical History:  Past Medical History:  Diagnosis Date  . Anxiety   . Broken wrist   . Depression   . Seizures (HCC)     Surgical History:  History reviewed. No pertinent surgical history.  Medications:  Current Outpatient Medications on File Prior to Visit  Medication Sig  . LamoTRIgine 200 MG TB24 24 hour tablet Take 2 tablets (400 mg total) by mouth once for 1 dose.   No current facility-administered medications on file prior to visit.     Allergies:  Allergies  Allergen Reactions  . Amoxil [Amoxicillin]   . Penicillins     Social History:  Social History   Socioeconomic History  . Marital status: Legally Separated    Spouse name: Not on file  . Number of children: Not on file  . Years of education: Not on file  . Highest education level: Not on file  Occupational History  . Not on  file  Social Needs  . Financial resource strain: Not on file  . Food insecurity:    Worry: Not on file    Inability: Not on file  . Transportation needs:    Medical: Not on file    Non-medical: Not on file  Tobacco Use  . Smoking status: Current Every Day Smoker    Packs/day: 0.50    Types: Cigarettes  . Smokeless tobacco: Former Engineer, waterUser  Substance and Sexual Activity  . Alcohol use: Yes    Alcohol/week: 0.0 standard drinks    Comment: on occasion  . Drug use: Yes    Types: Marijuana  . Sexual activity: Yes  Lifestyle  . Physical activity:    Days per week: Not on file    Minutes per session: Not on file  . Stress: Not on file  Relationships  . Social connections:    Talks on phone: Not on file    Gets together:  Not on file    Attends religious service: Not on file    Active member of club or organization: Not on file    Attends meetings of clubs or organizations: Not on file    Relationship status: Not on file  . Intimate partner violence:    Fear of current or ex partner: Not on file    Emotionally abused: Not on file    Physically abused: Not on file    Forced sexual activity: Not on file  Other Topics Concern  . Not on file  Social History Narrative  . Not on file   Social History   Tobacco Use  Smoking Status Current Every Day Smoker  . Packs/day: 0.50  . Types: Cigarettes  Smokeless Tobacco Former NeurosurgeonUser   Social History   Substance and Sexual Activity  Alcohol Use Yes  . Alcohol/week: 0.0 standard drinks   Comment: on occasion    Family History:  Family History  Problem Relation Age of Onset  . Diabetes Mother   . Hypertension Father   . Seizures Sister   . Allergies Daughter   . Asthma Son   . Heart disease Maternal Grandmother     Past medical history, surgical history, medications, allergies, family history and social history reviewed with patient today and changes made to appropriate areas of the chart.   Review of Systems  Constitutional: Negative for chills, fever, malaise/fatigue and weight loss.  HENT: Positive for congestion, ear pain and sinus pain. Negative for sore throat.   Eyes: Positive for discharge. Negative for blurred vision.  Respiratory: Negative for cough, shortness of breath and wheezing.   Cardiovascular: Negative for chest pain and palpitations.  Gastrointestinal: Negative for constipation, diarrhea, nausea and vomiting.  Musculoskeletal: Positive for back pain. Negative for myalgias.       Chronic thoracic back pain  Neurological: Positive for headaches. Negative for dizziness, seizures and weakness.    All other ROS negative except what is listed above and in the HPI.      Objective:    BP 135/86 (BP Location: Left Arm, Patient  Position: Sitting, Cuff Size: Normal)   Pulse 78   Temp 98.2 F (36.8 C) (Oral)   Ht 6' 2.41" (1.89 m)   Wt 150 lb (68 kg)   SpO2 97%   BMI 19.05 kg/m   Wt Readings from Last 3 Encounters:  09/27/18 150 lb (68 kg)  09/20/18 150 lb (68 kg)  03/22/18 144 lb 1.6 oz (65.4 kg)    Physical Exam Vitals signs  and nursing note reviewed.  Constitutional:      Appearance: He is well-developed.  HENT:     Head: Normocephalic and atraumatic.     Right Ear: Hearing, ear canal and external ear normal. No decreased hearing noted. No drainage. Tympanic membrane is retracted.     Left Ear: Hearing, ear canal and external ear normal. No decreased hearing noted. No drainage. Tympanic membrane is retracted.     Nose: Nasal tenderness, congestion and rhinorrhea present.     Right Sinus: Frontal sinus tenderness present. No maxillary sinus tenderness.     Left Sinus: Frontal sinus tenderness present. No maxillary sinus tenderness.     Mouth/Throat:     Mouth: Mucous membranes are moist.     Pharynx: Uvula midline. Posterior oropharyngeal erythema present.  Eyes:     General: Lids are normal.        Right eye: No discharge.        Left eye: No discharge.     Conjunctiva/sclera: Conjunctivae normal.     Pupils: Pupils are equal, round, and reactive to light.  Neck:     Musculoskeletal: Normal range of motion and neck supple.     Thyroid: No thyromegaly.     Vascular: No carotid bruit or JVD.     Trachea: Trachea normal.  Cardiovascular:     Rate and Rhythm: Normal rate and regular rhythm.     Heart sounds: Normal heart sounds, S1 normal and S2 normal. No murmur. No gallop.   Pulmonary:     Effort: Pulmonary effort is normal.     Breath sounds: Normal breath sounds.  Abdominal:     General: Bowel sounds are normal.     Palpations: Abdomen is soft. There is no hepatomegaly or splenomegaly.  Musculoskeletal: Normal range of motion.  Lymphadenopathy:     Cervical: Cervical adenopathy present.    Skin:    General: Skin is warm and dry.     Capillary Refill: Capillary refill takes less than 2 seconds.     Findings: No rash.  Neurological:     Mental Status: He is alert and oriented to person, place, and time.     Deep Tendon Reflexes: Reflexes are normal and symmetric.     Reflex Scores:      Brachioradialis reflexes are 2+ on the right side and 2+ on the left side.      Patellar reflexes are 2+ on the right side and 2+ on the left side. Psychiatric:        Behavior: Behavior normal.        Thought Content: Thought content normal.        Judgment: Judgment normal.      Results for orders placed or performed in visit on 09/20/18  Lamotrigine level  Result Value Ref Range   Lamotrigine Lvl 7.8 2.0 - 20.0 ug/mL  CBC with Differential/Platelets  Result Value Ref Range   WBC 9.6 3.4 - 10.8 x10E3/uL   RBC 4.86 4.14 - 5.80 x10E6/uL   Hemoglobin 15.7 13.0 - 17.7 g/dL   Hematocrit 92.0 10.0 - 51.0 %   MCV 93 79 - 97 fL   MCH 32.3 26.6 - 33.0 pg   MCHC 34.7 31.5 - 35.7 g/dL   RDW 71.2 19.7 - 58.8 %   Platelets 255 150 - 450 x10E3/uL   Neutrophils 49 Not Estab. %   Lymphs 35 Not Estab. %   Monocytes 8 Not Estab. %   Eos 7 Not Estab. %  Basos 1 Not Estab. %   Neutrophils Absolute 4.6 1.4 - 7.0 x10E3/uL   Lymphocytes Absolute 3.4 (H) 0.7 - 3.1 x10E3/uL   Monocytes Absolute 0.8 0.1 - 0.9 x10E3/uL   EOS (ABSOLUTE) 0.7 (H) 0.0 - 0.4 x10E3/uL   Basophils Absolute 0.1 0.0 - 0.2 x10E3/uL   Immature Granulocytes 0 Not Estab. %   Immature Grans (Abs) 0.0 0.0 - 0.1 x10E3/uL  CMP  Result Value Ref Range   Glucose 74 65 - 99 mg/dL   BUN 7 6 - 20 mg/dL   Creatinine, Ser 5.79 0.76 - 1.27 mg/dL   GFR calc non Af Amer 95 >59 mL/min/1.73   GFR calc Af Amer 110 >59 mL/min/1.73   BUN/Creatinine Ratio 7 (L) 9 - 20   Sodium 145 (H) 134 - 144 mmol/L   Potassium 4.4 3.5 - 5.2 mmol/L   Chloride 102 96 - 106 mmol/L   CO2 21 20 - 29 mmol/L   Calcium 10.1 8.7 - 10.2 mg/dL   Total Protein  7.3 6.0 - 8.5 g/dL   Albumin 4.8 4.0 - 5.0 g/dL   Globulin, Total 2.5 1.5 - 4.5 g/dL   Albumin/Globulin Ratio 1.9 1.2 - 2.2   Bilirubin Total 0.4 0.0 - 1.2 mg/dL   Alkaline Phosphatase 107 39 - 117 IU/L   AST 13 0 - 40 IU/L   ALT 14 0 - 44 IU/L      Assessment & Plan:   Problem List Items Addressed This Visit      Respiratory   Acute bacterial sinusitis    Doxycycline and fluticasone prescribed. Patient instructed to complete full course of antibiotics. Return if patients worsen or fail to improve.       Relevant Medications   doxycycline (VIBRA-TABS) 100 MG tablet   fluticasone (FLONASE) 50 MCG/ACT nasal spray     Nervous and Auditory   Seizure disorder (HCC)    Chronic, ongoing.  No recent seizure activity.  He is [facility] by [facility] and had visit 09/20/2018.  Continue to collaborate with neurology.       Cerebral palsy (HCC)    Chronic, stable and independent in all ADL's.  Followed by [facility].        Other   Depression    PHQ9 = 0 today.  He denies depressed mood.  Continue to monitor.       Other Visit Diagnoses    Annual physical exam    -  Primary   Relevant Orders   TSH   Lipid Panel w/o Chol/HDL Ratio       LABORATORY TESTING:  Health maintenance labs ordered today as discussed above.    IMMUNIZATIONS:   - Tdap: Tetanus vaccination status reviewed: ordered - Influenza -- refused - Pneumovax: Not applicable - Prevnar: Not applicable - Zostavax vaccine: Not applicable  SCREENING: - Colonoscopy: Not applicable  Discussed with patient purpose of the colonoscopy is to detect colon cancer at curable precancerous or early stages   - AAA Screening: Not applicable  -Hearing Test: Not applicable  -Spirometry: Not applicable   PATIENT COUNSELING:    Sexuality: Discussed sexually transmitted diseases, partner selection, use of condoms, avoidance of unintended pregnancy  and contraceptive alternatives.   Advised to avoid cigarette  smoking.  I discussed with the patient that most people either abstain from alcohol or drink within safe limits (<=14/week and <=4 drinks/occasion for males, <=7/weeks and <= 3 drinks/occasion for females) and that the risk for alcohol disorders and other health  effects rises proportionally with the number of drinks per week and how often a drinker exceeds daily limits.  Discussed cessation/primary prevention of drug use and availability of treatment for abuse.   Diet: Encouraged to adjust caloric intake to maintain  or achieve ideal body weight, to reduce intake of dietary saturated fat and total fat, to limit sodium intake by avoiding high sodium foods and not adding table salt, and to maintain adequate dietary potassium and calcium preferably from fresh fruits, vegetables, and low-fat dairy products.    stressed the importance of regular exercise  Injury prevention: Discussed safety belts, safety helmets, smoke detector, smoking near bedding or upholstery.   Dental health: Discussed importance of regular tooth brushing, flossing, and dental visits.   Follow up plan: NEXT PREVENTATIVE PHYSICAL DUE IN 1 YEAR. Return in about 6 months (around 03/28/2019) for follow-up.   NOTE WRITTEN BY [employer] DNP STUDENT.  ASSESSMENT AND PLAN OF CARE REVIEWED WITH STUDENT, AGREE WITH ABOVE FINDINGS AND PLAN.

## 2018-09-27 NOTE — Patient Instructions (Signed)
Be sure you finish all of your antibiotic. If your symptoms worsen or do not get better after finishing the antibiotic, please call us for a follow-up appointment.   Sinusitis, Adult Sinusitis is soreness and swelling (inflammation) of your sinuses. Sinuses are hollow spaces in the bones around your face. They are located:  Around your eyes.  In the middle of your forehead.  Behind your nose.  In your cheekbones. Your sinuses and nasal passages are lined with a fluid called mucus. Mucus drains out of your sinuses. Swelling can trap mucus in your sinuses. This lets germs (bacteria, virus, or fungus) grow, which leads to infection. Most of the time, this condition is caused by a virus. What are the causes? This condition is caused by:  Allergies.  Asthma.  Germs.  Things that block your nose or sinuses.  Growths in the nose (nasal polyps).  Chemicals or irritants in the air.  Fungus (rare). What increases the risk? You are more likely to develop this condition if:  You have a weak body defense system (immune system).  You do a lot of swimming or diving.  You use nasal sprays too much.  You smoke. What are the signs or symptoms? The main symptoms of this condition are pain and a feeling of pressure around the sinuses. Other symptoms include:  Stuffy nose (congestion).  Runny nose (drainage).  Swelling and warmth in the sinuses.  Headache.  Toothache.  A cough that may get worse at night.  Mucus that collects in the throat or the back of the nose (postnasal drip).  Being unable to smell and taste.  Being very tired (fatigue).  A fever.  Sore throat.  Bad breath. How is this diagnosed? This condition is diagnosed based on:  Your symptoms.  Your medical history.  A physical exam.  Tests to find out if your condition is short-term (acute) or long-term (chronic). Your doctor may: ? Check your nose for growths (polyps). ? Check your sinuses using a  tool that has a light (endoscope). ? Check for allergies or germs. ? Do imaging tests, such as an MRI or CT scan. How is this treated? Treatment for this condition depends on the cause and whether it is short-term or long-term.  If caused by a virus, your symptoms should go away on their own within 10 days. You may be given medicines to relieve symptoms. They include: ? Medicines that shrink swollen tissue in the nose. ? Medicines that treat allergies (antihistamines). ? A spray that treats swelling of the nostrils. ? Rinses that help get rid of thick mucus in your nose (nasal saline washes).  If caused by bacteria, your doctor may wait to see if you will get better without treatment. You may be given antibiotic medicine if you have: ? A very bad infection. ? A weak body defense system.  If caused by growths in the nose, you may need to have surgery. Follow these instructions at home: Medicines  Take, use, or apply over-the-counter and prescription medicines only as told by your doctor. These may include nasal sprays.  If you were prescribed an antibiotic medicine, take it as told by your doctor. Do not stop taking the antibiotic even if you start to feel better. Hydrate and humidify   Drink enough water to keep your pee (urine) pale yellow.  Use a cool mist humidifier to keep the humidity level in your home above 50%.  Breathe in steam for 10-15 minutes, 3-4 times a day,  or as told by your doctor. You can do this in the bathroom while a hot shower is running.  Try not to spend time in cool or dry air. Rest  Rest as much as you can.  Sleep with your head raised (elevated).  Make sure you get enough sleep each night. General instructions   Put a warm, moist washcloth on your face 3-4 times a day, or as often as told by your doctor. This will help with discomfort.  Wash your hands often with soap and water. If there is no soap and water, use hand sanitizer.  Do not  smoke. Avoid being around people who are smoking (secondhand smoke).  Keep all follow-up visits as told by your doctor. This is important. Contact a doctor if:  You have a fever.  Your symptoms get worse.  Your symptoms do not get better within 10 days. Get help right away if:  You have a very bad headache.  You cannot stop throwing up (vomiting).  You have very bad pain or swelling around your face or eyes.  You have trouble seeing.  You feel confused.  Your neck is stiff.  You have trouble breathing. Summary  Sinusitis is swelling of your sinuses. Sinuses are hollow spaces in the bones around your face.  This condition is caused by tissues in your nose that become inflamed or swollen. This traps germs. These can lead to infection.  If you were prescribed an antibiotic medicine, take it as told by your doctor. Do not stop taking it even if you start to feel better.  Keep all follow-up visits as told by your doctor. This is important. This information is not intended to replace advice given to you by your health care provider. Make sure you discuss any questions you have with your health care provider. Document Released: 01/13/2008 Document Revised: 12/27/2017 Document Reviewed: 12/27/2017 Elsevier Interactive Patient Education  2019 [employer].

## 2018-09-28 LAB — LIPID PANEL W/O CHOL/HDL RATIO
Cholesterol, Total: 133 mg/dL (ref 100–199)
HDL: 46 mg/dL (ref >39)
LDL Calculated: 66 mg/dL (ref 0–99)
Triglycerides: 105 mg/dL (ref 0–149)
VLDL Cholesterol Cal: 21 mg/dL (ref 5–40)

## 2018-09-28 LAB — TSH: TSH: 0.728 u[IU]/mL (ref 0.450–4.500)

## 2018-09-28 NOTE — Progress Notes (Signed)
Normal test results noted.  Please call patient and make them aware of normal results and will continue to monitor at regular visits.  Have a great day.  Look forward to seeing you at your next visit.

## 2018-12-19 ENCOUNTER — Encounter: Payer: Self-pay | Admitting: *Deleted

## 2018-12-19 ENCOUNTER — Telehealth: Payer: Self-pay | Admitting: *Deleted

## 2018-12-19 NOTE — Telephone Encounter (Signed)
Chart updated

## 2018-12-19 NOTE — Telephone Encounter (Signed)
12-19-18 Pt has called and gave verbal consent to file insurance for [email or web address] Pt email address is:[email or web address]  Pt understands that although there may be some limitations with this type of visit, we will take all precautions to reduce any security or privacy concerns.  Pt understands that this will be treated like an in office visit and we will file with pt's insurance, and there may be a patient responsible charge related to this service.

## 2018-12-19 NOTE — Telephone Encounter (Signed)
Spoke to pt.  He was not sure of email.  Ok'd to use mobile ([employer] phone).  [phone].  I will send text to her phone,  He would let her know. Text sent.

## 2018-12-19 NOTE — Telephone Encounter (Signed)
Called home # no answer, could not LM, then mobile.  LM x 1 to return call to convert to VV Due to current COVID 19 pandemic, our office is severely reducing in office visits until further notice, in order to minimize the risk to our patients and healthcare providers.

## 2018-12-20 ENCOUNTER — Other Ambulatory Visit: Payer: Self-pay

## 2018-12-20 ENCOUNTER — Encounter: Payer: Self-pay | Admitting: Neurology

## 2018-12-20 ENCOUNTER — Ambulatory Visit (INDEPENDENT_AMBULATORY_CARE_PROVIDER_SITE_OTHER): Payer: Medicaid Other | Admitting: Neurology

## 2018-12-20 DIAGNOSIS — G40909 Epilepsy, unspecified, not intractable, without status epilepticus: Secondary | ICD-10-CM | POA: Diagnosis not present

## 2018-12-20 MED ORDER — LAMOTRIGINE ER 200 MG PO TB24: 400.0000 mg | ORAL_TABLET | Freq: Every day | ORAL | 6 refills | Status: DC

## 2018-12-20 NOTE — Progress Notes (Signed)
Virtual Visit via Video Note  I connected with Edward Welch on 12/20/18 at 11:15 AM EDT by a video enabled telemedicine application and verified that I am speaking with the correct person using two identifiers.  Location: Patient: In his car Provider: In the office    I discussed the limitations of evaluation and management by telemedicine and the availability of in person appointments. The patient expressed understanding and agreed to proceed.  History of Present Illness: HISTORY Edward Welch a 38 year old male, seen in refer by his nurse practitioner Gabriel Cirri for evaluation of seizure, initial evaluation was on October 12, 2017. He is accompanied by his mother at today's clinical visit.   I reviewed and summarized the referring note, he was 2 weekspremature,noted left side difficulty early on, tends to crawl with his right side, start to walk untill 38 years old, mild developmentally slow, had extensive physical therapy, occupational therapy in early childhood,  He has strong family history of epilepsy at his father's side, stepsister from his father, and paternal uncle suffered epilepsy.  He began to have seizure at age 50, no warning signs, generalized tonic-clonic seizure, has been treated with Dilantin 400 mg every since, he has seizure 3-4 times each year, in addition, he has frequent nocturnal urinary accident, wet his bed unexpectedly 2-3 times each week, but there was no signs of nocturnal seizure associated with it.  Most recent seizure was in February 2019, while he was taking Dilantin ER 100 mg 4 tablets every night, Lyrica 200 mg every night was added on, tolerated it well.  He is on disability, lives at home with his mother, enjoying watching TV  CT head in 2015, MRI of the brain in 2007, right hemispheric encephalomalacia involving right frontal, parietal, temporal lobe, with overlying arachnoid cyst, mild to moderate cerebellar atrophy   Laboratory evaluation January 2019, normal CBC, CMP showed mild elevated glucose 105, mild elevated alkaline phosphate 129, Dilantin level was 9  UPDATE February 17 2018:  He had recurrent seizure in May 2019, when he was transitioned from Dilantin/Lyrica to lamotrigine, now he takeslamotrigine ER 300 mg every night, doing very well, he no longer has enuresisaccident,  EEG on Dec 21 2017:There is evidence of irritability involving right frontotemporal region, with intermittent slowing at F8, T4, T6, occasionally T4 sharp transient.  UPDATE September 20, 2018:  Edward Welch is a 38 year old male who presents for follow-up a long-standing history of seizures since he was 38 years old. He is currently taking lamotrigine 300 mg 24-hr tablet. He is tolerating the medication well. He reports a grand mal seizure 5 weeks ago, lasted 60 seconds, witnessed by his mom. The seizure occurred when he was getting out of bed. His mom heard him shaking. He took 5 minutes to come around, the he was able to talk with his mom. He did bite his tongue during the seizure.  He believes it was stress-related, and as result of sleep deprivation. Sometimes he doesn't sleep well at night because he isn't tired. Prior to this event, his last seizure was Feb 2019 when he was converted from Dilantin to Lamotrigine. He reports stress related to his ex-wife and kids as a seizure trigger. He currently lives with his mom and is on disability. In his free time, he hangs out with friends and watches TV. He no longer drives a car. He denies problems with his gait, rash, or any falls. He presents for follow-up accompanied by his mom today.  Update Dec 20, 2018 SS: Last seizure January 2020, lamotrigine XR was increased to 400 mg daily, he reports he has not had any seizures since last visit, he says one time he did not eat and he nearly passed out.  He reports compliance with his medication, he has not missed any doses, he is tolerating  the medication well.  He reports the stressors in his life have calmed down.  He denies any trouble with his walking or balance.  He does not drive a car, he does not have a driver's license, he lives with his mom, no new problems or concerns, has been doing well.   Observations/Objective: In his car, alert, answers questions appropriately, speech is clear and concise, follows commands  Assessment and Plan: 1.  Seizures  He has not had any recurrent seizures.  I will check lab work today.  He will come to the office in the next week to have it done.  He will continue taking lamotrigine 24 hr tablet 200 mg, taking 2 tablets at bedtime.  I will go ahead and send in a refill so he does not run out.  He will let us know of any recurrent seizure activity.  He currently does not drive, does not have a driver's license, we discussed he should not drive within 6 months of seizure event.  Follow Up Instructions: 6 months    I discussed the assessment and treatment plan with the patient. The patient was provided an opportunity to ask questions and all were answered. The patient agreed with the plan and demonstrated an understanding of the instructions.   The patient was advised to call back or seek an in-person evaluation if the symptoms worsen or if the condition fails to improve as anticipated.  I provided 20 minutes of non-face-to-face time during this encounter.   Edward Welch, AGNP-C, DNP  [facility] [address], [address] [place], [place] [id number] [phone]

## 2018-12-26 NOTE — Progress Notes (Signed)
I have reviewed and agreed above plan. 

## 2019-07-14 ENCOUNTER — Other Ambulatory Visit: Payer: Self-pay

## 2019-07-14 DIAGNOSIS — Z20822 Contact with and (suspected) exposure to covid-19: Secondary | ICD-10-CM

## 2019-07-17 ENCOUNTER — Telehealth: Payer: Self-pay | Admitting: *Deleted

## 2019-07-17 LAB — NOVEL CORONAVIRUS, NAA: SARS-CoV-2, NAA: NOT DETECTED

## 2019-07-17 NOTE — Telephone Encounter (Signed)
Patient called for results ,still pending . 

## 2019-07-18 ENCOUNTER — Telehealth: Payer: Self-pay

## 2019-07-18 NOTE — Telephone Encounter (Signed)
Patient given negative result and verbalized understanding  

## 2020-01-15 ENCOUNTER — Other Ambulatory Visit: Payer: Self-pay | Admitting: Neurology

## 2020-01-15 ENCOUNTER — Other Ambulatory Visit: Payer: Self-pay | Admitting: Nurse Practitioner

## 2020-01-15 MED ORDER — LAMOTRIGINE ER 200 MG PO TB24: 400.0000 mg | ORAL_TABLET | Freq: Every day | ORAL | 0 refills | Status: DC

## 2020-01-15 NOTE — Telephone Encounter (Signed)
Medication Refill - Medication: LamoTRIgine 200 MG TB24 24 hour tablet [Pharmacy Med Name: LAMOTRIGINE ER 200 MG TABLET]   Has the patient contacted their pharmacy? Yes.   (Agent: If no, request that the patient contact the pharmacy for the refill.) (Agent: If yes, when and what did the pharmacy advise?)  Preferred Pharmacy (with phone number or street name):  [id number] - [place], [place] - [address]  [address] [place] [address]  Phone: [phone] Fax: [phone]     Agent: Please be advised that RX refills may take up to 3 business days. We ask that you follow-up with your pharmacy.

## 2020-01-16 ENCOUNTER — Telehealth: Payer: Self-pay

## 2020-01-16 NOTE — Telephone Encounter (Signed)
Prior Authorization initiated via [employer] for Lamotrigine Confirmation #: [id number]  PA Approved

## 2020-03-25 ENCOUNTER — Emergency Department
Admission: EM | Admit: 2020-03-25 | Discharge: 2020-03-25 | Disposition: A | Discharge status: Home / Self Care | Payer: Medicaid Other | Attending: Emergency Medicine | Admitting: Emergency Medicine

## 2020-03-25 ENCOUNTER — Emergency Department: Payer: Medicaid Other

## 2020-03-25 ENCOUNTER — Other Ambulatory Visit: Payer: Self-pay

## 2020-03-25 DIAGNOSIS — F17210 Nicotine dependence, cigarettes, uncomplicated: Secondary | ICD-10-CM | POA: Diagnosis not present

## 2020-03-25 DIAGNOSIS — M795 Residual foreign body in soft tissue: Secondary | ICD-10-CM | POA: Insufficient documentation

## 2020-03-25 DIAGNOSIS — M79672 Pain in left foot: Secondary | ICD-10-CM | POA: Diagnosis present

## 2020-03-25 MED ORDER — LEVOFLOXACIN 750 MG PO TABS
750.0000 mg | ORAL_TABLET | Freq: Every day | ORAL | 0 refills | Status: AC
Start: 2020-03-25 — End: 2020-04-04

## 2020-03-25 MED ORDER — CLINDAMYCIN HCL 300 MG PO CAPS
300.0000 mg | ORAL_CAPSULE | Freq: Four times a day (QID) | ORAL | 0 refills | Status: AC
Start: 2020-03-25 — End: 2020-04-04

## 2020-03-25 MED ORDER — LIDOCAINE-EPINEPHRINE-TETRACAINE (LET) TOPICAL GEL
3.0000 mL | Freq: Once | TOPICAL | Status: AC
  Administered 2020-03-25: 3 mL via TOPICAL
  Filled 2020-03-25: qty 3

## 2020-03-25 MED ORDER — ACETAMINOPHEN 325 MG PO TABS
325.0000 mg | ORAL_TABLET | Freq: Once | ORAL | Status: AC
  Administered 2020-03-25: 325 mg via ORAL
  Filled 2020-03-25: qty 1

## 2020-03-25 MED ORDER — LIDOCAINE-EPINEPHRINE (PF) 2 %-1:200000 IJ SOLN
10.0000 mL | Freq: Once | INTRAMUSCULAR | Status: AC
  Administered 2020-03-25: 10 mL via INTRADERMAL
  Filled 2020-03-25: qty 10

## 2020-03-25 MED ORDER — OXYCODONE-ACETAMINOPHEN 5-325 MG PO TABS
1.0000 | ORAL_TABLET | Freq: Once | ORAL | Status: AC
  Administered 2020-03-25: 1 via ORAL
  Filled 2020-03-25: qty 1

## 2020-03-25 NOTE — ED Triage Notes (Signed)
Pt comes via POV from home with c./o left foot pain after stepping on a rock. Pt has place noted to bottom of heel.

## 2020-03-25 NOTE — ED Notes (Signed)
See triage note  Presents with pain to bottom of left foot  States he stepped on something that caused the pain

## 2020-03-25 NOTE — ED Provider Notes (Signed)
[facility] Emergency Department Provider Note   ____________________________________________   First MD Initiated Contact with Patient 03/25/20 1136     (approximate)  I have reviewed the triage vital signs and the nursing notes.   HISTORY  Chief Complaint Foot Pain    HPI Edward Welch is a 39 y.o. male who presents to the emergency department for evaluation of foreign body sensation and pain in the left heel.  The patient states that 2 days ago he was walking in [place], when he stepped on what he believes was a sharp rock.  The patient removed a portion of a pebble, but has had persistent pain and believes there is something still in it.  He is unable to weight-bear on the heel of his foot, and can only walk on his toes.  Pain is described as a 8/10 and is sharp.       Past Medical History:  Diagnosis Date  . Anxiety   . Broken wrist   . Depression   . Seizures [facility])     Patient Active Problem List   Diagnosis Date Noted  . Acute bacterial sinusitis 09/27/2018  . Partial symptomatic epilepsy with complex partial seizures, not intractable, without status epilepticus (HCC) 10/12/2017  . Cerebral palsy (HCC) 09/21/2017  . Depression 04/02/2015  . Seizure disorder (HCC) 04/02/2015  . Nicotine dependence, cigarettes, uncomplicated 04/02/2015  . Migraine 04/02/2015    History reviewed. No pertinent surgical history.  Prior to Admission medications   Medication Sig Start Date End Date Taking? Authorizing Provider  clindamycin (CLEOCIN) 300 MG capsule Take 1 capsule (300 mg total) by mouth 4 (four) times daily for 10 days. 03/25/20 04/04/20  Lucy Chris, PA  fluticasone (FLONASE) 50 MCG/ACT nasal spray Place 2 sprays into both nostrils daily. 09/27/18   Aura Dials T, NP  LamoTRIgine 200 MG TB24 24 hour tablet Take 2 tablets (400 mg total) by mouth daily. 01/15/20   Cannady, Corrie Dandy T, NP  levofloxacin (LEVAQUIN) 750 MG tablet Take 1  tablet (750 mg total) by mouth daily for 10 days. 03/25/20 04/04/20  Lucy Chris, PA    Allergies Amoxil [amoxicillin] and Penicillins  Family History  Problem Relation Age of Onset  . Diabetes Mother   . Hypertension Father   . Seizures Sister   . Allergies Daughter   . Asthma Son   . Heart disease Maternal Grandmother     Social History Social History   Tobacco Use  . Smoking status: Current Every Day Smoker    Packs/day: 0.50    Types: Cigarettes  . Smokeless tobacco: Former Engineer, water Use Topics  . Alcohol use: Yes    Alcohol/week: 0.0 standard drinks    Comment: on occasion  . Drug use: Yes    Types: Marijuana    Review of Systems  Constitutional: No fever/chills Cardiovascular: Denies chest pain. Respiratory: Denies shortness of breath. Musculoskeletal: Negative for back pain. + Foot pain Skin: Negative for rash. + Cut on foot   ____________________________________________   PHYSICAL EXAM:  VITAL SIGNS: ED Triage Vitals [03/25/20 1123]  Enc Vitals Group     BP (!) 131/92     Pulse Rate 75     Resp 18     Temp 98.6 F (37 C)     Temp src      SpO2 94 %     Weight 175 lb (79.4 kg)     Height 6\' 2"  (1.88 m)  Head Circumference      Peak Flow      Pain Score 6     Pain Loc      Pain Edu?      Excl. in GC?     Constitutional: Alert and oriented. Well appearing and in no acute distress. Head: Atraumatic. Cardiovascular: Normal rate, regular rhythm.  Respiratory: Normal respiratory effort.  No retractions. Musculoskeletal: There is a 2 cm laceration to the plantar surface of the left heel, approximately 4 cm from the most posterior edge.  There is obvious lint debris as well as dirt in the cut.  Patient has full range of motion of the foot and ankle Neurologic:   No gross focal neurologic deficits are appreciated.  Gait affected by likely foreign body. Skin:  Skin is warm, dry and intact except as described above. No rash  noted. Psychiatric: Mood and affect are normal. Speech and behavior are normal.  ____________________________________________  RADIOLOGY   Official radiology report(s): DG Foot 2 Views Left  Result Date: 03/25/2020 CLINICAL DATA:  Status post foreign body retrieval EXAM: LEFT FOOT - 2 VIEW COMPARISON:  12:40 p.m. FINDINGS: Previously noted radiopaque foreign body within the soft tissues subjacent to the posterior calcaneus has been removed. No residual radiopaque foreign body. Tiny focus of subcutaneous gas noted. No fracture or dislocation. No ankle effusion. Joint spaces are preserved. IMPRESSION: Interval removal of radiopaque foreign body. No residual radiopaque foreign body. Tiny focus of subcutaneous gas noted. Electronically Signed   By: Helyn Numbers MD   On: 03/25/2020 14:22   DG Foot 2 Views Left  Result Date: 03/25/2020 CLINICAL DATA:  Left foot pain EXAM: LEFT FOOT - 2 VIEW COMPARISON:  None. FINDINGS: Two view radiograph left foot demonstrates a a shard like radiopaque foreign body within the plantar soft tissues subjacent to the posterior calcaneus measuring 8 mm x 3 mm. Normal alignment. No fracture or dislocation. Joint spaces are preserved. No ankle effusion. IMPRESSION: Radiopaque foreign body within the plantar soft tissues subjacent to the posterior calcaneus. Electronically Signed   By: Helyn Numbers MD   On: 03/25/2020 12:55    ____________________________________________   PROCEDURES  Procedure(s) performed (including Critical Care):  .Foreign Body Removal  Date/Time: 03/25/2020 3:19 PM Performed by: Lucy Chris, PA Authorized by: Lucy Chris, PA  Intake: left foot. Anesthesia: local infiltration  Anesthesia: Local Anesthetic: lidocaine 2% with epinephrine and LET (lido,epi,tetracaine) Anesthetic total: 2 mL  Sedation: Patient sedated: no  Patient restrained: no Complexity: simple 2 objects recovered. Objects recovered: two pieces of  shell Post-procedure assessment: foreign body removed Patient tolerance: patient tolerated the procedure well with no immediate complications     ____________________________________________   INITIAL IMPRESSION / ASSESSMENT AND PLAN / ED COURSE  As part of my medical decision making, I reviewed the following data within the electronic MEDICAL RECORD NUMBER Radiograph reviewed of left foot.  Edward Welch is a 39 year old male who presents today for foreign body of the left foot.  Obtained 2 days ago while walking in a river bed.  He was able to self remove a pebble, but feels there is still something stuck in the foot.  This is supported by both physical exam and initial x-ray of the left foot.  To foreign bodies a shell-like substance were removed.  He tolerated the procedure well.  Post removal x-rays were confirmed due to still having some pain about the area.  These were reassuring that all foreign bodies were removed.  The wound was cleaned with normal saline and iodine.  Patient was instructed to keep it clean and covered.  Due to risk of contaminated water from river bed, patient was prescribed clindamycin and levofloxacin.  Patient advised to take ibuprofen and/or Tylenol as needed for pain as the wound heals.  Infection precautions given.  Patient will return for increasing pain, drainage, or erythema about the area.  Edward Welch was evaluated in Emergency Department on 03/25/2020 for the symptoms described in the history of present illness. He was evaluated in the context of the global COVID-19 pandemic, which necessitated consideration that the patient might be at risk for infection with the SARS-CoV-2 virus that causes COVID-19. Institutional protocols and algorithms that pertain to the evaluation of patients at risk for COVID-19 are in a state of rapid change based on information released by regulatory bodies including the CDC and federal and state organizations. These policies and  algorithms were followed during the patient's care in the ED.               ____________________________________________   FINAL CLINICAL IMPRESSION(S) / ED DIAGNOSES  Final diagnoses:  Foreign body (FB) in soft tissue     ED Discharge Orders         Ordered    clindamycin (CLEOCIN) 300 MG capsule  4 times daily     Discontinue  Reprint     03/25/20 1432    levofloxacin (LEVAQUIN) 750 MG tablet  Daily     Discontinue  Reprint     03/25/20 1432           Note:  This document was prepared using Dragon voice recognition software and may include unintentional dictation errors.    Lucy Chris, PA 03/25/20 1523    Sharman Cheek, MD 03/26/20 [phone]

## 2020-04-05 ENCOUNTER — Other Ambulatory Visit: Payer: Self-pay | Admitting: Nurse Practitioner

## 2020-04-05 NOTE — Telephone Encounter (Signed)
Patient called and advised he will need to schedule a physical, since last one done 1 year ago, he verbalized understanding and asked for either this Tuesday or the next Tuesday. Appointment scheduled for Tuesday, 04/16/20 at 1040 with Aura Dials, NP.

## 2020-04-05 NOTE — Telephone Encounter (Signed)
Courtesy refill given until scheduled appointment on 04/16/20.

## 2020-04-16 ENCOUNTER — Telehealth: Payer: Self-pay | Admitting: Nurse Practitioner

## 2020-04-16 ENCOUNTER — Other Ambulatory Visit: Payer: Self-pay | Admitting: Nurse Practitioner

## 2020-04-16 ENCOUNTER — Encounter: Payer: Self-pay | Admitting: Nurse Practitioner

## 2020-04-16 MED ORDER — LAMOTRIGINE ER 200 MG PO TB24: 2.0000 | ORAL_TABLET | Freq: Every day | ORAL | 0 refills | Status: DC

## 2020-04-16 NOTE — Telephone Encounter (Signed)
Routing to provider  

## 2020-04-16 NOTE — Telephone Encounter (Signed)
Sent in 30 day supply. 

## 2020-04-16 NOTE — Telephone Encounter (Signed)
Copied from CRM [id number]. Topic: General - Inquiry >> Apr 16, 2020 12:09 PM Floria Raveling A wrote: Reason for CRM: mother called in and stated that she worked a double yesterday and over slept for the appt this morning.  It is rech to next Tuesday but pt is out of seizer meds .  She needs to know if there can be some called in to make it to the appt  [facility] [place]  Best number [phone]

## 2020-04-16 NOTE — Telephone Encounter (Signed)
Called and LVM letting patient's mother know that 1 month supply was sent in for him.

## 2020-04-23 ENCOUNTER — Other Ambulatory Visit: Payer: Self-pay | Admitting: Nurse Practitioner

## 2020-04-23 ENCOUNTER — Encounter: Payer: Self-pay | Admitting: Nurse Practitioner

## 2020-05-02 ENCOUNTER — Ambulatory Visit (INDEPENDENT_AMBULATORY_CARE_PROVIDER_SITE_OTHER): Payer: Medicaid Other | Admitting: Unknown Physician Specialty

## 2020-05-02 ENCOUNTER — Other Ambulatory Visit: Payer: Self-pay

## 2020-05-02 ENCOUNTER — Encounter: Payer: Self-pay | Admitting: Unknown Physician Specialty

## 2020-05-02 VITALS — BP 107/67 | HR 65 | Temp 97.5°F | Resp 16 | Ht 73.0 in | Wt 150.0 lb

## 2020-05-02 DIAGNOSIS — Z5181 Encounter for therapeutic drug level monitoring: Secondary | ICD-10-CM | POA: Diagnosis not present

## 2020-05-02 DIAGNOSIS — G40909 Epilepsy, unspecified, not intractable, without status epilepticus: Secondary | ICD-10-CM

## 2020-05-02 DIAGNOSIS — G802 Spastic hemiplegic cerebral palsy: Secondary | ICD-10-CM | POA: Diagnosis not present

## 2020-05-02 DIAGNOSIS — Z0000 Encounter for general adult medical examination without abnormal findings: Secondary | ICD-10-CM | POA: Diagnosis not present

## 2020-05-02 DIAGNOSIS — F17210 Nicotine dependence, cigarettes, uncomplicated: Secondary | ICD-10-CM | POA: Diagnosis not present

## 2020-05-02 NOTE — Patient Instructions (Signed)
For assistance in finding a vaccine clinic near you, please call [phone]. Find all vaccine locations throughout the state of [place] at [email or web address]

## 2020-05-02 NOTE — Assessment & Plan Note (Signed)
Check Lamictal levels.  Consider increasing is lower end of normal

## 2020-05-02 NOTE — Progress Notes (Signed)
BP 107/67   Pulse 65   Temp (!) 97.5 F (36.4 C) (Temporal)   Resp 16   Ht 6\' 1"  (1.854 m)   Wt 150 lb (68 kg)   SpO2 98%   BMI 19.79 kg/m    Subjective:    Patient ID: , male    DOB: Dec 15, 1980, 39 y.o.   MRN: [id number]  HPI: Edward Welch is a 39 y.o. male  Chief Complaint  Patient presents with  . Annual Exam    denies flu shot but wants lab for hep C as well   Seizure Had one a week ago.  States the one before that was a year ago.  States the Lamictal helps.  Was on Dilantin and Lyrica but was changed by neruology.  Has not seen them for a while  Functional Status Survey: Is the patient deaf or have difficulty hearing?: No Does the patient have difficulty seeing, even when wearing glasses/contacts?: No Does the patient have difficulty concentrating, remembering, or making decisions?: No Does the patient have difficulty walking or climbing stairs?: No Does the patient have difficulty dressing or bathing?: No Does the patient have difficulty doing errands alone such as visiting a doctor's office or shopping?: No  Depression screen [facility] 2/9 05/02/2020 09/21/2017 08/17/2017 12/06/2015 04/16/2015  Decreased Interest 0 0 0 0 1  Down, Depressed, Hopeless 0 1 2 0 -  PHQ - 2 Score 0 1 2 0 1  Altered sleeping - - 0 - -  Tired, decreased energy - 1 0 - -  Change in appetite - 0 0 - -  Feeling bad or failure about yourself  - 2 1 - -  Trouble concentrating - 0 0 - -  Moving slowly or fidgety/restless - 1 0 - -  Suicidal thoughts - 0 0 - -  PHQ-9 Score - - 3 - -   Fall Risk  05/02/2020 12/06/2015  Falls in the past year? 0 No  Number falls in past yr: 0 -  Injury with Fall? 0 -  Follow up Falls evaluation completed -   Smoking Not ready to quit.  He is considering.  States he might quit if he is less active.    Relevant past medical, surgical, family and social history reviewed and updated as indicated. Interim medical history since our last visit  reviewed. Allergies and medications reviewed and updated.  Review of Systems  Per HPI unless specifically indicated above     Objective:    BP 107/67   Pulse 65   Temp (!) 97.5 F (36.4 C) (Temporal)   Resp 16   Ht 6\' 1"  (1.854 m)   Wt 150 lb (68 kg)   SpO2 98%   BMI 19.79 kg/m   Wt Readings from Last 3 Encounters:  05/02/20 150 lb (68 kg)  03/25/20 175 lb (79.4 kg)  09/27/18 150 lb (68 kg)    Physical Exam Constitutional:      Appearance: He is well-developed.  HENT:     Head: Normocephalic.     Right Ear: Tympanic membrane, ear canal and external ear normal.     Left Ear: Tympanic membrane, ear canal and external ear normal.     Mouth/Throat:     Pharynx: Uvula midline.  Eyes:     Pupils: Pupils are equal, round, and reactive to light.  Cardiovascular:     Rate and Rhythm: Normal rate and regular rhythm.     Heart sounds: Normal heart  sounds. No murmur heard.  No friction rub. No gallop.   Pulmonary:     Effort: Pulmonary effort is normal. No respiratory distress.     Breath sounds: Normal breath sounds.  Abdominal:     General: Bowel sounds are normal. There is no distension.     Palpations: Abdomen is soft.     Tenderness: There is no abdominal tenderness.  Musculoskeletal:     Comments: Partial paralysis at baseline  Skin:    General: Skin is warm and dry.  Neurological:     Mental Status: He is alert and oriented to person, place, and time.     Deep Tendon Reflexes: Reflexes are normal and symmetric.  Psychiatric:        Behavior: Behavior normal.        Thought Content: Thought content normal.        Judgment: Judgment normal.     Results for orders placed or performed in visit on 07/14/19  Novel Coronavirus, NAA ([facility])   Specimen: Nasopharyngeal(NP) swabs in vial transport medium   NASOPHARYNGE  TESTING  Result Value Ref Range   SARS-CoV-2, NAA Not Detected Not Detected      Assessment & Plan:   Problem List Items Addressed This Visit       Unprioritized   Cerebral palsy (HCC) - Primary    Stable disease without risk of injury      Nicotine dependence, cigarettes, uncomplicated    Pt ed on smoking cessation.  Not ready yet      Seizure disorder (HCC)    Check Lamictal levels.  Consider increasing is lower end of normal      Relevant Orders   Lamotrigine level    Other Visit Diagnoses    Medication monitoring encounter       Relevant Orders   Comprehensive metabolic panel   Lipid Panel w/o Chol/HDL Ratio   CBC with Differential/Platelet   Routine general medical examination at a health care facility       Relevant Orders   Hepatitis C antibody      Number given for Covid vaccines.  Hep C screening done.  Refusing flu vaccine Follow up plan: No follow-ups on file.

## 2020-05-02 NOTE — Assessment & Plan Note (Signed)
Pt ed on smoking cessation.  Not ready yet

## 2020-05-02 NOTE — Assessment & Plan Note (Signed)
Stable disease without risk of injury

## 2020-05-03 LAB — LIPID PANEL W/O CHOL/HDL RATIO
Cholesterol, Total: 145 mg/dL (ref 100–199)
HDL: 47 mg/dL (ref >39)
LDL Chol Calc (NIH): 84 mg/dL (ref 0–99)
Triglycerides: 72 mg/dL (ref 0–149)
VLDL Cholesterol Cal: 14 mg/dL (ref 5–40)

## 2020-05-03 LAB — CBC WITH DIFFERENTIAL/PLATELET
Basophils Absolute: 0.2 10*3/uL (ref 0.0–0.2)
Basos: 2 %
EOS (ABSOLUTE): 0.4 10*3/uL (ref 0.0–0.4)
Eos: 5 %
Hematocrit: 49.2 % (ref 37.5–51.0)
Hemoglobin: 17.4 g/dL (ref 13.0–17.7)
Immature Grans (Abs): 0 10*3/uL (ref 0.0–0.1)
Immature Granulocytes: 0 %
Lymphocytes Absolute: 3.1 10*3/uL (ref 0.7–3.1)
Lymphs: 39 %
MCH: 33.7 pg — ABNORMAL HIGH (ref 26.6–33.0)
MCHC: 35.4 g/dL (ref 31.5–35.7)
MCV: 95 fL (ref 79–97)
Monocytes Absolute: 0.6 10*3/uL (ref 0.1–0.9)
Monocytes: 8 %
Neutrophils Absolute: 3.6 10*3/uL (ref 1.4–7.0)
Neutrophils: 46 %
Platelets: 242 10*3/uL (ref 150–450)
RBC: 5.17 x10E6/uL (ref 4.14–5.80)
RDW: 12 % (ref 11.6–15.4)
WBC: 7.9 10*3/uL (ref 3.4–10.8)

## 2020-05-03 LAB — COMPREHENSIVE METABOLIC PANEL
ALT: 11 IU/L (ref 0–44)
AST: 15 IU/L (ref 0–40)
Albumin/Globulin Ratio: 1.8 (ref 1.2–2.2)
Albumin: 4.8 g/dL (ref 4.0–5.0)
Alkaline Phosphatase: 119 IU/L (ref 44–121)
BUN/Creatinine Ratio: 9 (ref 9–20)
BUN: 11 mg/dL (ref 6–20)
Bilirubin Total: 0.7 mg/dL (ref 0.0–1.2)
CO2: 23 mmol/L (ref 20–29)
Calcium: 9.8 mg/dL (ref 8.7–10.2)
Chloride: 102 mmol/L (ref 96–106)
Creatinine, Ser: 1.2 mg/dL (ref 0.76–1.27)
GFR calc Af Amer: 88 mL/min/{1.73_m2} (ref >59)
GFR calc non Af Amer: 76 mL/min/{1.73_m2} (ref >59)
Globulin, Total: 2.7 g/dL (ref 1.5–4.5)
Glucose: 100 mg/dL — ABNORMAL HIGH (ref 65–99)
Potassium: 4.4 mmol/L (ref 3.5–5.2)
Sodium: 142 mmol/L (ref 134–144)
Total Protein: 7.5 g/dL (ref 6.0–8.5)

## 2020-05-03 LAB — LAMOTRIGINE LEVEL: Lamotrigine Lvl: 9.1 ug/mL (ref 2.0–20.0)

## 2020-05-03 LAB — HEPATITIS C ANTIBODY: Hep C Virus Ab: <0.1 s/co ratio (ref 0.0–0.9)

## 2020-05-09 ENCOUNTER — Other Ambulatory Visit: Payer: Self-pay | Admitting: Nurse Practitioner

## 2020-05-13 ENCOUNTER — Other Ambulatory Visit: Payer: Self-pay | Admitting: Nurse Practitioner

## 2020-05-13 NOTE — Telephone Encounter (Signed)
PT need a refill  LamoTRIgine 200 MG TB24 24 hour tablet [[id number]]  [id number] - [place] [place] - [address]  [address] [place] [address]  Phone: [phone] Fax: [phone]

## 2020-10-02 ENCOUNTER — Other Ambulatory Visit: Payer: Self-pay | Admitting: Nurse Practitioner

## 2020-10-02 NOTE — Telephone Encounter (Signed)
Requested medication (s) are due for refill today: Yes  Requested medication (s) are on the active medication list: Yes  Last refill:  05/10/20  Future visit scheduled: No  Notes to clinic:  See request.    Requested Prescriptions  Pending Prescriptions Disp Refills   LamoTRIgine 200 MG TB24 24 hour tablet [Pharmacy Med Name: LAMOTRIGINE ER 200 MG TABLET] 180 tablet 1    Sig: TAKE 2 TABLETS BY MOUTH EVERY DAY      Not Delegated - Neurology:  Anticonvulsants Failed - 10/02/2020  2:34 PM      Failed - This refill cannot be delegated      Passed - HCT in normal range and within 360 days    Hematocrit  Date Value Ref Range Status  05/02/2020 49.2 37.5 - 51.0 % Final          Passed - HGB in normal range and within 360 days    Hemoglobin  Date Value Ref Range Status  05/02/2020 17.4 13.0 - 17.7 g/dL Final          Passed - PLT in normal range and within 360 days    Platelets  Date Value Ref Range Status  05/02/2020 242 150 - 450 x10E3/uL Final          Passed - WBC in normal range and within 360 days    WBC  Date Value Ref Range Status  05/02/2020 7.9 3.4 - 10.8 x10E3/uL Final          Passed - Valid encounter within last 12 months    Recent Outpatient Visits           5 months ago Spastic hemiplegic cerebral palsy (HCC)   [facility] Gabriel Cirri, NP   2 years ago Annual physical exam   [facility] [place], Corrie Dandy T, NP   3 years ago Acute non-recurrent maxillary sinusitis   [facility] Gabriel Cirri, NP   3 years ago Seizure disorder [facility])   [facility] Gabriel Cirri, NP   4 years ago Health care maintenance   [facility] Gabriel Cirri, NP                 Signed Prescriptions Disp Refills   fluticasone (FLONASE) 50 MCG/ACT nasal spray 16 mL 2    Sig: SPRAY 2 SPRAYS INTO EACH NOSTRIL EVERY DAY      Ear, Nose, and Throat: Nasal Preparations - Corticosteroids Passed - 10/02/2020   2:34 PM      Passed - Valid encounter within last 12 months    Recent Outpatient Visits           5 months ago Spastic hemiplegic cerebral palsy (HCC)   [facility] Gabriel Cirri, NP   2 years ago Annual physical exam   [facility] [place], Corrie Dandy T, NP   3 years ago Acute non-recurrent maxillary sinusitis   [facility] Gabriel Cirri, NP   3 years ago Seizure disorder [facility])   [facility] Gabriel Cirri, NP   4 years ago Health care maintenance   [facility] Gabriel Cirri, NP

## 2020-10-02 NOTE — Telephone Encounter (Signed)
Routing to provider  

## 2020-10-28 DIAGNOSIS — Z0289 Encounter for other administrative examinations: Secondary | ICD-10-CM

## 2021-04-15 ENCOUNTER — Other Ambulatory Visit: Payer: Self-pay | Admitting: Nurse Practitioner

## 2021-04-15 NOTE — Telephone Encounter (Signed)
Requested medication (s) are due for refill today: yes  Requested medication (s) are on the active medication list: yes  Last refill:  10/02/20 #180 with 1 refill  Future visit scheduled: No  Notes to clinic:  Please review for refill. Refill not delegated per protocol.    Requested Prescriptions  Pending Prescriptions Disp Refills   LamoTRIgine 200 MG TB24 24 hour tablet [Pharmacy Med Name: LAMOTRIGINE ER 200 MG TABLET] 180 tablet 1    Sig: TAKE 2 TABLETS BY MOUTH EVERY DAY     Not Delegated - Neurology:  Anticonvulsants Failed - 04/15/2021  2:42 PM      Failed - This refill cannot be delegated      Passed - HCT in normal range and within 360 days    Hematocrit  Date Value Ref Range Status  05/02/2020 49.2 37.5 - 51.0 % Final          Passed - HGB in normal range and within 360 days    Hemoglobin  Date Value Ref Range Status  05/02/2020 17.4 13.0 - 17.7 g/dL Final          Passed - PLT in normal range and within 360 days    Platelets  Date Value Ref Range Status  05/02/2020 242 150 - 450 x10E3/uL Final          Passed - WBC in normal range and within 360 days    WBC  Date Value Ref Range Status  05/02/2020 7.9 3.4 - 10.8 x10E3/uL Final          Passed - Valid encounter within last 12 months    Recent Outpatient Visits           11 months ago Spastic hemiplegic cerebral palsy (HCC)   [facility] Gabriel Cirri, NP   2 years ago Annual physical exam   [facility] [place], Corrie Dandy T, NP   3 years ago Acute non-recurrent maxillary sinusitis   [facility] Gabriel Cirri, NP   3 years ago Seizure disorder [facility])   [facility] Gabriel Cirri, NP   5 years ago Health care maintenance   [facility] Gabriel Cirri, NP

## 2021-04-16 NOTE — Telephone Encounter (Signed)
Patient has not been seen in almost a year. Please call to schedule a follow up visit.

## 2021-06-20 ENCOUNTER — Other Ambulatory Visit: Payer: Self-pay

## 2021-06-20 NOTE — Telephone Encounter (Signed)
Entered in error

## 2021-06-24 ENCOUNTER — Telehealth: Payer: Self-pay | Admitting: Nurse Practitioner

## 2021-06-24 ENCOUNTER — Telehealth: Payer: Self-pay

## 2021-06-24 MED ORDER — LAMOTRIGINE ER 200 MG PO TB24: 2.0000 | ORAL_TABLET | Freq: Every day | ORAL | 0 refills | Status: DC

## 2021-06-24 NOTE — Telephone Encounter (Signed)
Received a fax from patient pharmacy requesting a new prescription for Lamotrigine. Patient was last seen in office 05/02/20. Patient would need an appointment for refill?

## 2021-06-24 NOTE — Telephone Encounter (Signed)
Attempted to call the patient [phone], phone did not ring.

## 2021-06-24 NOTE — Telephone Encounter (Signed)
Patient called to verify the pharmacy to send medication refill to, numbers on file are not active (home and cell); 2nd home number recording call could not be completed at this time.

## 2021-06-24 NOTE — Telephone Encounter (Signed)
Medication Refill - Medication:  LamoTRIgine 200 MG TB24 24 hour tablet   Has the patient contacted their pharmacy? Yes.   Contact PCP-no refills on file  Preferred Pharmacy (with phone number or street name):  [facility] [phone] - Closed - [place], [place] - [address]  [address] [place] [address]  Phone:  [phone]  Fax:  [phone]  Has the patient been seen for an appointment in the last year OR does the patient have an upcoming appointment? Yes.    Agent: Please be advised that RX refills may take up to 3 business days. We ask that you follow-up with your pharmacy.

## 2021-07-25 ENCOUNTER — Encounter: Payer: Self-pay | Admitting: Nurse Practitioner

## 2021-07-25 ENCOUNTER — Other Ambulatory Visit: Payer: Self-pay

## 2021-07-25 ENCOUNTER — Ambulatory Visit (INDEPENDENT_AMBULATORY_CARE_PROVIDER_SITE_OTHER): Payer: Medicaid Other | Admitting: Nurse Practitioner

## 2021-07-25 VITALS — BP 105/70 | HR 69 | Temp 97.8°F | Ht 74.0 in | Wt 144.4 lb

## 2021-07-25 DIAGNOSIS — Z13220 Encounter for screening for lipoid disorders: Secondary | ICD-10-CM | POA: Diagnosis not present

## 2021-07-25 DIAGNOSIS — J301 Allergic rhinitis due to pollen: Secondary | ICD-10-CM

## 2021-07-25 DIAGNOSIS — J309 Allergic rhinitis, unspecified: Secondary | ICD-10-CM | POA: Insufficient documentation

## 2021-07-25 DIAGNOSIS — Z136 Encounter for screening for cardiovascular disorders: Secondary | ICD-10-CM | POA: Diagnosis not present

## 2021-07-25 DIAGNOSIS — G40209 Localization-related (focal) (partial) symptomatic epilepsy and epileptic syndromes with complex partial seizures, not intractable, without status epilepticus: Secondary | ICD-10-CM | POA: Diagnosis not present

## 2021-07-25 DIAGNOSIS — Z0000 Encounter for general adult medical examination without abnormal findings: Secondary | ICD-10-CM

## 2021-07-25 DIAGNOSIS — F17210 Nicotine dependence, cigarettes, uncomplicated: Secondary | ICD-10-CM

## 2021-07-25 DIAGNOSIS — G802 Spastic hemiplegic cerebral palsy: Secondary | ICD-10-CM | POA: Diagnosis not present

## 2021-07-25 MED ORDER — LAMOTRIGINE ER 200 MG PO TB24: 2.0000 | ORAL_TABLET | Freq: Every day | ORAL | 4 refills | Status: DC

## 2021-07-25 MED ORDER — FLUTICASONE PROPIONATE 50 MCG/ACT NA SUSP: NASAL | 4 refills | Status: DC

## 2021-07-25 MED ORDER — LORATADINE 10 MG PO TABS
10.0000 mg | ORAL_TABLET | Freq: Every day | ORAL | 4 refills | Status: DC
Start: 2021-07-25 — End: 2024-02-09

## 2021-07-25 NOTE — Assessment & Plan Note (Signed)
Chronic, stable.  Will send refills on Flonase and Claritin today.  Consider allergist referral as needed.

## 2021-07-25 NOTE — Assessment & Plan Note (Signed)
I have recommended complete cessation of tobacco use. I have discussed various options available for assistance with tobacco cessation including over the counter methods (Nicotine gum, patch and lozenges). We also discussed prescription options (Chantix, Nicotine Inhaler / Nasal Spray). The patient is not interested in pursuing any prescription tobacco cessation options at this time.  

## 2021-07-25 NOTE — Patient Instructions (Signed)
Health Maintenance, Male Adopting a healthy lifestyle and getting preventive care are important in promoting health and wellness. Ask your health care provider about: The right schedule for you to have regular tests and exams. Things you can do on your own to prevent diseases and keep yourself healthy. What should I know about diet, weight, and exercise? Eat a healthy diet  Eat a diet that includes plenty of vegetables, fruits, low-fat dairy products, and lean protein. Do not eat a lot of foods that are high in solid fats, added sugars, or sodium. Maintain a healthy weight Body mass index (BMI) is a measurement that can be used to identify possible weight problems. It estimates body fat based on height and weight. Your health care provider can help determine your BMI and help you achieve or maintain a healthy weight. Get regular exercise Get regular exercise. This is one of the most important things you can do for your health. Most adults should: Exercise for at least 150 minutes each week. The exercise should increase your heart rate and make you sweat (moderate-intensity exercise). Do strengthening exercises at least twice a week. This is in addition to the moderate-intensity exercise. Spend less time sitting. Even light physical activity can be beneficial. Watch cholesterol and blood lipids Have your blood tested for lipids and cholesterol at 39 years of age, then have this test every 5 years. You may need to have your cholesterol levels checked more often if: Your lipid or cholesterol levels are high. You are older than 40 years of age. You are at high risk for heart disease. What should I know about cancer screening? Many types of cancers can be detected early and may often be prevented. Depending on your health history and family history, you may need to have cancer screening at various ages. This may include screening for: Colorectal cancer. Prostate cancer. Skin cancer. Lung  cancer. What should I know about heart disease, diabetes, and high blood pressure? Blood pressure and heart disease High blood pressure causes heart disease and increases the risk of stroke. This is more likely to develop in people who have high blood pressure readings or are overweight. Talk with your health care provider about your target blood pressure readings. Have your blood pressure checked: Every 3-5 years if you are 18-39 years of age. Every year if you are 40 years old or older. If you are between the ages of 65 and 75 and are a current or former smoker, ask your health care provider if you should have a one-time screening for abdominal aortic aneurysm (AAA). Diabetes Have regular diabetes screenings. This checks your fasting blood sugar level. Have the screening done: Once every three years after age 45 if you are at a normal weight and have a low risk for diabetes. More often and at a younger age if you are overweight or have a high risk for diabetes. What should I know about preventing infection? Hepatitis B If you have a higher risk for hepatitis B, you should be screened for this virus. Talk with your health care provider to find out if you are at risk for hepatitis B infection. Hepatitis C Blood testing is recommended for: Everyone born from 1945 through 1965. Anyone with known risk factors for hepatitis C. Sexually transmitted infections (STIs) You should be screened each year for STIs, including gonorrhea and chlamydia, if: You are sexually active and are younger than 39 years of age. You are older than 39 years of age and your   health care provider tells you that you are at risk for this type of infection. Your sexual activity has changed since you were last screened, and you are at increased risk for chlamydia or gonorrhea. Ask your health care provider if you are at risk. Ask your health care provider about whether you are at high risk for HIV. Your health care provider  may recommend a prescription medicine to help prevent HIV infection. If you choose to take medicine to prevent HIV, you should first get tested for HIV. You should then be tested every 3 months for as long as you are taking the medicine. Follow these instructions at home: Alcohol use Do not drink alcohol if your health care provider tells you not to drink. If you drink alcohol: Limit how much you have to 0-2 drinks a day. Know how much alcohol is in your drink. In the [place] one drink equals one 12 oz bottle of beer (355 mL), one 5 oz glass of wine (148 mL), or one 1 oz glass of hard liquor (44 mL). Lifestyle Do not use any products that contain nicotine or tobacco. These products include cigarettes, chewing tobacco, and vaping devices, such as e-cigarettes. If you need help quitting, ask your health care provider. Do not use street drugs. Do not share needles. Ask your health care provider for help if you need support or information about quitting drugs. General instructions Schedule regular health, dental, and eye exams. Stay current with your vaccines. Tell your health care provider if: You often feel depressed. You have ever been abused or do not feel safe at home. Summary Adopting a healthy lifestyle and getting preventive care are important in promoting health and wellness. Follow your health care provider's instructions about healthy diet, exercising, and getting tested or screened for diseases. Follow your health care provider's instructions on monitoring your cholesterol and blood pressure. This information is not intended to replace advice given to you by your health care provider. Make sure you discuss any questions you have with your health care provider. Document Revised: 12/16/2020 Document Reviewed: 12/16/2020 Elsevier Patient Education  2022 [employer] Inc.  American Heart Association (AHA) Exercise Recommendation  Being physically active is important to prevent heart  disease and stroke, the nation's No. 1and No. 5killers. To improve overall cardiovascular health, we suggest at least 150 minutes per week of moderate exercise or 75 minutes per week of vigorous exercise (or a combination of moderate and vigorous activity). Thirty minutes a day, five times a week is an easy goal to remember. You will also experience benefits even if you divide your time into two or three segments of 10 to 15 minutes per day.  For people who would benefit from lowering their blood pressure or cholesterol, we recommend 40 minutes of aerobic exercise of moderate to vigorous intensity three to four times a week to lower the risk for heart attack and stroke.  Physical activity is anything that makes you move your body and burn calories.  This includes things like climbing stairs or playing sports. Aerobic exercises benefit your heart, and include walking, jogging, swimming or biking. Strength and stretching exercises are best for overall stamina and flexibility.  The simplest, positive change you can make to effectively improve your heart health is to start walking. It's enjoyable, free, easy, social and great exercise. A walking program is flexible and boasts high success rates because people can stick with it. It's easy for walking to become a regular and satisfying part of   life.   For Overall Cardiovascular Health: At least 30 minutes of moderate-intensity aerobic activity at least 5 days per week for a total of 150  OR  At least 25 minutes of vigorous aerobic activity at least 3 days per week for a total of 75 minutes; or a combination of moderate- and vigorous-intensity aerobic activity  AND  Moderate- to high-intensity muscle-strengthening activity at least 2 days per week for additional health benefits.  For Lowering Blood Pressure and Cholesterol An average 40 minutes of moderate- to vigorous-intensity aerobic activity 3 or 4 times per week  What if I can't make it to the  time goal? Something is always better than nothing! And everyone has to start somewhere. Even if you've been sedentary for years, today is the day you can begin to make healthy changes in your life. If you don't think you'll make it for 30 or 40 minutes, set a reachable goal for today. You can work up toward your overall goal by increasing your time as you get stronger. Don't let all-or-nothing thinking rob you of doing what you can every day.  Source:http://www.heart.org    

## 2021-07-25 NOTE — Assessment & Plan Note (Signed)
Chronic, stable and independent in all ADL's.  Followed by [facility] in past, return as needed.

## 2021-07-25 NOTE — Assessment & Plan Note (Signed)
Chronic, stable.  Followed by [facility] neurology in past, return as needed.  Continue Lamictal as current dosing, seizure free for some time.  Check Lamictal level and kidney function today.

## 2021-07-25 NOTE — Progress Notes (Signed)
BP 105/70    Pulse 69    Temp 97.8 F (36.6 C) (Oral)    Ht 6\' 2"  (1.88 m)    Wt 144 lb 6.4 oz (65.5 kg)    SpO2 99%    BMI 18.54 kg/m    Subjective:    Patient ID: Edward Welch, male    DOB: Oct 08, 1980, 40 y.o.   MRN: [id number]  HPI: Edward Welch is a 40 y.o. male presenting on 07/25/2021 for comprehensive medical examination. Current medical complaints include:none  He currently lives with: mother Interim Problems from his last visit: no  SEIZURE DISORDER AND CEREBRAL PALSY: Currently followed by [facility] neurology in past and was last seen by Slappey Denmark NP on 09/20/2018.  At that visit they increased his Lamotrigine to 400 MG 24 hour tablet daily.  He reports no seizure activity for approx one year.  He is to return to neurology as needed.    Continues to smoke, smokes 1/2 PPD.  Started smoking around age 83.  Not interested in quitting at this time.     Functional Status Survey: Is the patient deaf or have difficulty hearing?: No Does the patient have difficulty seeing, even when wearing glasses/contacts?: No Does the patient have difficulty concentrating, remembering, or making decisions?: No Does the patient have difficulty walking or climbing stairs?: No Does the patient have difficulty dressing or bathing?: No Does the patient have difficulty doing errands alone such as visiting a doctor's office or shopping?: No  FALL RISK: Fall Risk  07/25/2021 05/02/2020 12/06/2015  Falls in the past year? 0 0 No  Number falls in past yr: 0 0 -  Injury with Fall? 0 0 -  Risk for fall due to : No Fall Risks - -  Follow up Falls evaluation completed Falls evaluation completed -    Depression Screen Depression screen [facility] 2/9 07/25/2021 05/02/2020 09/21/2017 08/17/2017 12/06/2015  Decreased Interest 0 0 0 0 0  Down, Depressed, Hopeless 0 0 1 2 0  PHQ - 2 Score 0 0 1 2 0  Altered sleeping 0 - - 0 -  Tired, decreased energy 0 - 1 0 -  Change in appetite 0 - 0 0 -  Feeling bad or  failure about yourself  0 - 2 1 -  Trouble concentrating 0 - 0 0 -  Moving slowly or fidgety/restless 0 - 1 0 -  Suicidal thoughts 0 - 0 0 -  PHQ-9 Score 0 - - 3 -  Difficult doing work/chores Not difficult at all - - - -    Advanced Directives <no information>  Past Medical History:  Past Medical History:  Diagnosis Date   Anxiety    Broken wrist    Depression    Seizures ([place])     Surgical History:  History reviewed. No pertinent surgical history.  Medications:  No current outpatient medications on file prior to visit.   No current facility-administered medications on file prior to visit.    Allergies:  Allergies  Allergen Reactions   Amoxil [Amoxicillin]    Penicillins     Social History:  Social History   Socioeconomic History   Marital status: Legally Separated    Spouse name: Not on file   Number of children: Not on file   Years of education: Not on file   Highest education level: Not on file  Occupational History   Not on file  Tobacco Use   Smoking status: Every Day  Packs/day: 0.50    Types: Cigarettes   Smokeless tobacco: Former  Substance and Sexual Activity   Alcohol use: Yes    Alcohol/week: 0.0 standard drinks    Comment: on occasion   Drug use: Yes    Types: Marijuana   Sexual activity: Yes  Other Topics Concern   Not on file  Social History Narrative   Not on file   Social Determinants of Health   Financial Resource Strain: Low Risk    Difficulty of Paying Living Expenses: Not hard at all  Food Insecurity: No Food Insecurity   Worried About Charity fundraiser in the Last Year: Never true   [place] in the Last Year: Never true  Transportation Needs: No Transportation Needs   Lack of Transportation (Medical): No   Lack of Transportation (Non-Medical): No  Physical Activity: Insufficiently Active   Days of Exercise per Week: 3 days   Minutes of Exercise per Session: 30 min  Stress: No Stress Concern Present    Feeling of Stress : Not at all  Social Connections: Socially Isolated   Frequency of Communication with Friends and Family: Three times a week   Frequency of Social Gatherings with Friends and Family: Three times a week   Attends Religious Services: Never   Active Member of Clubs or Organizations: No   Attends Music therapist: Never   Marital Status: Never married  Human resources officer Violence: Not At Risk   Fear of Current or Ex-Partner: No   Emotionally Abused: No   Physically Abused: No   Sexually Abused: No   Social History   Tobacco Use  Smoking Status Every Day   Packs/day: 0.50   Types: Cigarettes  Smokeless Tobacco Former   Social History   Substance and Sexual Activity  Alcohol Use Yes   Alcohol/week: 0.0 standard drinks   Comment: on occasion    Family History:  Family History  Problem Relation Age of Onset   Diabetes Mother    Hypertension Father    Seizures Sister    Allergies Daughter    Asthma Son    Heart disease Maternal Grandmother     Past medical history, surgical history, medications, allergies, family history and social history reviewed with patient today and changes made to appropriate areas of the chart.   ROS All other ROS negative except what is listed above and in the HPI.      Objective:    BP 105/70    Pulse 69    Temp 97.8 F (36.6 C) (Oral)    Ht 6\' 2"  (1.88 m)    Wt 144 lb 6.4 oz (65.5 kg)    SpO2 99%    BMI 18.54 kg/m   Wt Readings from Last 3 Encounters:  07/25/21 144 lb 6.4 oz (65.5 kg)  05/02/20 150 lb (68 kg)  03/25/20 175 lb (79.4 kg)    Physical Exam Vitals and nursing note reviewed.  Constitutional:      General: He is awake. He is not in acute distress.    Appearance: He is well-developed and well-groomed. He is not ill-appearing or toxic-appearing.  HENT:     Head: Normocephalic and atraumatic.     Right Ear: Hearing, tympanic membrane, ear canal and external ear normal. No drainage.     Left Ear:  Hearing, tympanic membrane, ear canal and external ear normal. No drainage.     Nose: Nose normal.     Mouth/Throat:  Pharynx: Uvula midline.  Eyes:     General: Lids are normal.        Right eye: No discharge.        Left eye: No discharge.     Extraocular Movements: Extraocular movements intact.     Conjunctiva/sclera: Conjunctivae normal.     Pupils: Pupils are equal, round, and reactive to light.     Visual Fields: Right eye visual fields normal and left eye visual fields normal.  Neck:     Thyroid: No thyromegaly.     Vascular: No carotid bruit or JVD.     Trachea: Trachea normal.  Cardiovascular:     Rate and Rhythm: Normal rate and regular rhythm.     Heart sounds: Normal heart sounds, S1 normal and S2 normal. No murmur heard.   No gallop.  Pulmonary:     Effort: Pulmonary effort is normal. No accessory muscle usage or respiratory distress.     Breath sounds: Normal breath sounds.  Abdominal:     General: Bowel sounds are normal.     Palpations: Abdomen is soft. There is no hepatomegaly or splenomegaly.     Tenderness: There is no abdominal tenderness.  Musculoskeletal:        General: Normal range of motion.     Cervical back: Normal range of motion and neck supple.     Right lower leg: No edema.     Left lower leg: No edema.  Lymphadenopathy:     Head:     Right side of head: No submental, submandibular, tonsillar, preauricular or posterior auricular adenopathy.     Left side of head: No submental, submandibular, tonsillar, preauricular or posterior auricular adenopathy.     Cervical: No cervical adenopathy.  Skin:    General: Skin is warm and dry.     Capillary Refill: Capillary refill takes less than 2 seconds.     Findings: No rash.  Neurological:     Mental Status: He is alert and oriented to person, place, and time.     Gait: Gait is intact.     Deep Tendon Reflexes: Reflexes are normal and symmetric.     Reflex Scores:      Brachioradialis reflexes are  2+ on the right side and 2+ on the left side.      Patellar reflexes are 2+ on the right side and 2+ on the left side. Psychiatric:        Attention and Perception: Attention normal.        Mood and Affect: Mood normal.        Speech: Speech normal.        Behavior: Behavior normal. Behavior is cooperative.        Thought Content: Thought content normal.        Cognition and Memory: Cognition normal.     Results for orders placed or performed in visit on 05/02/20  Lamotrigine level  Result Value Ref Range   Lamotrigine Lvl 9.1 2.0 - 20.0 ug/mL  Comprehensive metabolic panel  Result Value Ref Range   Glucose 100 (H) 65 - 99 mg/dL   BUN 11 6 - 20 mg/dL   Creatinine, Ser 1.20 0.76 - 1.27 mg/dL   GFR calc non Af Amer 76 >59 mL/min/1.73   GFR calc Af Amer 88 >59 mL/min/1.73   BUN/Creatinine Ratio 9 9 - 20   Sodium 142 134 - 144 mmol/L   Potassium 4.4 3.5 - 5.2 mmol/L   Chloride 102 96 - 106  mmol/L   CO2 23 20 - 29 mmol/L   Calcium 9.8 8.7 - 10.2 mg/dL   Total Protein 7.5 6.0 - 8.5 g/dL   Albumin 4.8 4.0 - 5.0 g/dL   Globulin, Total 2.7 1.5 - 4.5 g/dL   Albumin/Globulin Ratio 1.8 1.2 - 2.2   Bilirubin Total 0.7 0.0 - 1.2 mg/dL   Alkaline Phosphatase 119 44 - 121 IU/L   AST 15 0 - 40 IU/L   ALT 11 0 - 44 IU/L  Lipid Panel w/o Chol/HDL Ratio  Result Value Ref Range   Cholesterol, Total 145 100 - 199 mg/dL   Triglycerides 72 0 - 149 mg/dL   HDL 47 >18 mg/dL   VLDL Cholesterol Cal 14 5 - 40 mg/dL   LDL Chol Calc (NIH) 84 0 - 99 mg/dL  CBC with Differential/Platelet  Result Value Ref Range   WBC 7.9 3.4 - 10.8 x10E3/uL   RBC 5.17 4.14 - 5.80 x10E6/uL   Hemoglobin 17.4 13.0 - 17.7 g/dL   Hematocrit 29.9 37.1 - 51.0 %   MCV 95 79 - 97 fL   MCH 33.7 (H) 26.6 - 33.0 pg   MCHC 35.4 31.5 - 35.7 g/dL   RDW 69.6 78.9 - 38.1 %   Platelets 242 150 - 450 x10E3/uL   Neutrophils 46 Not Estab. %   Lymphs 39 Not Estab. %   Monocytes 8 Not Estab. %   Eos 5 Not Estab. %   Basos 2 Not  Estab. %   Neutrophils Absolute 3.6 1.4 - 7.0 x10E3/uL   Lymphocytes Absolute 3.1 0.7 - 3.1 x10E3/uL   Monocytes Absolute 0.6 0.1 - 0.9 x10E3/uL   EOS (ABSOLUTE) 0.4 0.0 - 0.4 x10E3/uL   Basophils Absolute 0.2 0.0 - 0.2 x10E3/uL   Immature Granulocytes 0 Not Estab. %   Immature Grans (Abs) 0.0 0.0 - 0.1 x10E3/uL  Hepatitis C antibody  Result Value Ref Range   Hep C Virus Ab <0.1 0.0 - 0.9 s/co ratio      Assessment & Plan:   Problem List Items Addressed This Visit       Respiratory   Allergic rhinitis    Chronic, stable.  Will send refills on Flonase and Claritin today.  Consider allergist referral as needed.        Nervous and Auditory   Cerebral palsy (HCC)    Chronic, stable and independent in all ADL's.  Followed by [facility]rology in past, return as needed.      Relevant Orders   CBC with Differential/Platelet   Comprehensive metabolic panel   TSH   Partial symptomatic epilepsy with complex partial seizures, not intractable, without status epilepticus (HCC) - Primary    Chronic, stable.  Followed by [facility] neurology in past, return as needed.  Continue Lamictal as current dosing, seizure free for some time.  Check Lamictal level and kidney function today.      Relevant Medications   LamoTRIgine 200 MG TB24 24 hour tablet   Other Relevant Orders   Lamotrigine level     Other   Nicotine dependence, cigarettes, uncomplicated    I have recommended complete cessation of tobacco use. I have discussed various options available for assistance with tobacco cessation including over the counter methods (Nicotine gum, patch and lozenges). We also discussed prescription options (Chantix, Nicotine Inhaler / Nasal Spray). The patient is not interested in pursuing any prescription tobacco cessation options at this time.       Other Visit Diagnoses  Encounter for lipid screening for cardiovascular disease       Lipid panel today.   Relevant Orders   Lipid Panel w/o  Chol/HDL Ratio   Encounter for annual physical exam       Annual physical today with labs and health maintenance reviewed.       Discussed aspirin prophylaxis for myocardial infarction prevention and decision was it was not indicated  LABORATORY TESTING:  Health maintenance labs ordered today as discussed above.   IMMUNIZATIONS:   - Tdap: Tetanus vaccination status reviewed: last tetanus booster within 10 years. - Influenza: Refused - Pneumovax: Not applicable - Prevnar: Not applicable - Zostavax vaccine: Not applicable - COVID vaccinations == did not get, refuses  SCREENING: - Colonoscopy: Not applicable  Discussed with patient purpose of the colonoscopy is to detect colon cancer at curable precancerous or early stages   - AAA Screening: Not applicable  -Hearing Test: Not applicable  -Spirometry: Not applicable   PATIENT COUNSELING:    Sexuality: Discussed sexually transmitted diseases, partner selection, use of condoms, avoidance of unintended pregnancy  and contraceptive alternatives.   Advised to avoid cigarette smoking.  I discussed with the patient that most people either abstain from alcohol or drink within safe limits (<=14/week and <=4 drinks/occasion for males, <=7/weeks and <= 3 drinks/occasion for females) and that the risk for alcohol disorders and other health effects rises proportionally with the number of drinks per week and how often a drinker exceeds daily limits.  Discussed cessation/primary prevention of drug use and availability of treatment for abuse.   Diet: Encouraged to adjust caloric intake to maintain  or achieve ideal body weight, to reduce intake of dietary saturated fat and total fat, to limit sodium intake by avoiding high sodium foods and not adding table salt, and to maintain adequate dietary potassium and calcium preferably from fresh fruits, vegetables, and low-fat dairy products.    Stressed the importance of regular exercise  Injury  prevention: Discussed safety belts, safety helmets, smoke detector, smoking near bedding or upholstery.   Dental health: Discussed importance of regular tooth brushing, flossing, and dental visits.   Follow up plan: NEXT PREVENTATIVE PHYSICAL DUE IN 1 YEAR. Return in about 1 year (around 07/25/2022) for Annual physical.

## 2021-07-29 LAB — COMPREHENSIVE METABOLIC PANEL
ALT: 15 IU/L (ref 0–44)
AST: 18 IU/L (ref 0–40)
Albumin/Globulin Ratio: 2.2 (ref 1.2–2.2)
Albumin: 5.1 g/dL — ABNORMAL HIGH (ref 4.0–5.0)
Alkaline Phosphatase: 128 IU/L — ABNORMAL HIGH (ref 44–121)
BUN/Creatinine Ratio: 10 (ref 9–20)
BUN: 11 mg/dL (ref 6–20)
Bilirubin Total: 0.3 mg/dL (ref 0.0–1.2)
CO2: 24 mmol/L (ref 20–29)
Calcium: 10.3 mg/dL — ABNORMAL HIGH (ref 8.7–10.2)
Chloride: 100 mmol/L (ref 96–106)
Creatinine, Ser: 1.08 mg/dL (ref 0.76–1.27)
Globulin, Total: 2.3 g/dL (ref 1.5–4.5)
Glucose: 106 mg/dL — ABNORMAL HIGH (ref 70–99)
Potassium: 4.8 mmol/L (ref 3.5–5.2)
Sodium: 140 mmol/L (ref 134–144)
Total Protein: 7.4 g/dL (ref 6.0–8.5)
eGFR: 90 mL/min/{1.73_m2} (ref >59)

## 2021-07-29 LAB — CBC WITH DIFFERENTIAL/PLATELET
Basophils Absolute: 0.1 10*3/uL (ref 0.0–0.2)
Basos: 1 %
EOS (ABSOLUTE): 0.9 10*3/uL — ABNORMAL HIGH (ref 0.0–0.4)
Eos: 11 %
Hematocrit: 47 % (ref 37.5–51.0)
Hemoglobin: 16.9 g/dL (ref 13.0–17.7)
Immature Grans (Abs): 0 10*3/uL (ref 0.0–0.1)
Immature Granulocytes: 0 %
Lymphocytes Absolute: 3.8 10*3/uL — ABNORMAL HIGH (ref 0.7–3.1)
Lymphs: 49 %
MCH: 33.3 pg — ABNORMAL HIGH (ref 26.6–33.0)
MCHC: 36 g/dL — ABNORMAL HIGH (ref 31.5–35.7)
MCV: 93 fL (ref 79–97)
Monocytes Absolute: 0.7 10*3/uL (ref 0.1–0.9)
Monocytes: 8 %
Neutrophils Absolute: 2.5 10*3/uL (ref 1.4–7.0)
Neutrophils: 31 %
Platelets: 267 10*3/uL (ref 150–450)
RBC: 5.08 x10E6/uL (ref 4.14–5.80)
RDW: 11.8 % (ref 11.6–15.4)
WBC: 7.9 10*3/uL (ref 3.4–10.8)

## 2021-07-29 LAB — LIPID PANEL W/O CHOL/HDL RATIO
Cholesterol, Total: 149 mg/dL (ref 100–199)
HDL: 46 mg/dL (ref >39)
LDL Chol Calc (NIH): 88 mg/dL (ref 0–99)
Triglycerides: 74 mg/dL (ref 0–149)
VLDL Cholesterol Cal: 15 mg/dL (ref 5–40)

## 2021-07-29 LAB — LAMOTRIGINE LEVEL: Lamotrigine Lvl: 10.4 ug/mL (ref 2.0–20.0)

## 2021-07-29 LAB — TSH: TSH: 1.06 u[IU]/mL (ref 0.450–4.500)

## 2021-07-29 NOTE — Progress Notes (Signed)
Good afternoon, please let Jameis know his labs have returned.  Everything is remaining stable, no changes to medications needed.  Continue the great work!! Keep being awesome!!  Thank you for allowing me to participate in your care.  I appreciate you. Kindest regards, Rosland Riding

## 2021-08-09 ENCOUNTER — Emergency Department: Payer: Medicaid Other

## 2021-08-09 ENCOUNTER — Other Ambulatory Visit: Payer: Self-pay

## 2021-08-09 ENCOUNTER — Emergency Department
Admission: EM | Admit: 2021-08-09 | Discharge: 2021-08-10 | Disposition: A | Discharge status: Home / Self Care | Payer: Medicaid Other | Attending: Emergency Medicine | Admitting: Emergency Medicine

## 2021-08-09 DIAGNOSIS — S065XAA Traumatic subdural hemorrhage with loss of consciousness status unknown, initial encounter: Secondary | ICD-10-CM

## 2021-08-09 DIAGNOSIS — F17210 Nicotine dependence, cigarettes, uncomplicated: Secondary | ICD-10-CM | POA: Diagnosis not present

## 2021-08-09 DIAGNOSIS — W19XXXA Unspecified fall, initial encounter: Secondary | ICD-10-CM | POA: Insufficient documentation

## 2021-08-09 DIAGNOSIS — Y9301 Activity, walking, marching and hiking: Secondary | ICD-10-CM | POA: Insufficient documentation

## 2021-08-09 DIAGNOSIS — S0240FA Zygomatic fracture, left side, initial encounter for closed fracture: Secondary | ICD-10-CM | POA: Insufficient documentation

## 2021-08-09 DIAGNOSIS — S0240DA Maxillary fracture, left side, initial encounter for closed fracture: Secondary | ICD-10-CM | POA: Diagnosis not present

## 2021-08-09 DIAGNOSIS — R55 Syncope and collapse: Secondary | ICD-10-CM

## 2021-08-09 DIAGNOSIS — S065X9A Traumatic subdural hemorrhage with loss of consciousness of unspecified duration, initial encounter: Secondary | ICD-10-CM | POA: Insufficient documentation

## 2021-08-09 DIAGNOSIS — S0990XA Unspecified injury of head, initial encounter: Secondary | ICD-10-CM | POA: Diagnosis present

## 2021-08-09 LAB — CBC
HCT: 46.8 % (ref 39.0–52.0)
Hemoglobin: 16.1 g/dL (ref 13.0–17.0)
MCH: 32.4 pg (ref 26.0–34.0)
MCHC: 34.4 g/dL (ref 30.0–36.0)
MCV: 94.2 fL (ref 80.0–100.0)
Platelets: 254 10*3/uL (ref 150–400)
RBC: 4.97 MIL/uL (ref 4.22–5.81)
RDW: 11.6 % (ref 11.5–15.5)
WBC: 14.1 10*3/uL — ABNORMAL HIGH (ref 4.0–10.5)
nRBC: 0 % (ref 0.0–0.2)

## 2021-08-09 LAB — URINALYSIS, ROUTINE W REFLEX MICROSCOPIC
Bilirubin Urine: NEGATIVE
Glucose, UA: NEGATIVE mg/dL
Hgb urine dipstick: NEGATIVE
Ketones, ur: NEGATIVE mg/dL
Leukocytes,Ua: NEGATIVE
Nitrite: NEGATIVE
Protein, ur: NEGATIVE mg/dL
Specific Gravity, Urine: 1.012 (ref 1.005–1.030)
pH: 6 (ref 5.0–8.0)

## 2021-08-09 LAB — BASIC METABOLIC PANEL
Anion gap: 7 (ref 5–15)
BUN: 6 mg/dL (ref 6–20)
CO2: 30 mmol/L (ref 22–32)
Calcium: 9.9 mg/dL (ref 8.9–10.3)
Chloride: 102 mmol/L (ref 98–111)
Creatinine, Ser: 0.96 mg/dL (ref 0.61–1.24)
GFR, Estimated: >60 mL/min (ref >60)
Glucose, Bld: 124 mg/dL — ABNORMAL HIGH (ref 70–99)
Potassium: 4 mmol/L (ref 3.5–5.1)
Sodium: 139 mmol/L (ref 135–145)

## 2021-08-09 MED ORDER — HYDROCODONE-ACETAMINOPHEN 5-325 MG PO TABS
1.0000 | ORAL_TABLET | Freq: Once | ORAL | Status: AC
  Administered 2021-08-09: 1 via ORAL
  Filled 2021-08-09: qty 1

## 2021-08-09 NOTE — ED Triage Notes (Signed)
Pt states he was headed to bed and the next thing he remembers was being on the floor. Pt states he has a history of seizures, pt states fall was witnessed, no seizure activity noted. Pt states he was told he did not lose consciousness for long. Pt states he fell on wood floor. Pt denies N/V. Pt with large hematoma to left eye, pt unable to open left eye. Pt with laceration to side of left eye, mother states it looked rather large but pt refused to go to ER last night. Pt denies other injuries and has full ROM to all extremities.

## 2021-08-09 NOTE — Consult Note (Addendum)
24M suffered fall last night. Severe left eye swelling, but vision intact. No double vision. Bite is normal.    Exam:  OS periorbital ecchymosis and edema with inability to open eye. When manually opened, vision is intact and EOMI. There is conjunctival hemorrhage but not chemosis/proptosis.  Facial sensation intact Occlusion normal, poor dentition Oral cavity unremarkable.   CT scan reveals comminuted fractures of anterior/posterior left maxilla but minimally displaced orbital floor/rim and zygomatic arch.  Patient with comminuted left ZMC fracture, no entrapment, minimally displaced orbital floor/rim and arch. Vision, EOM normal. Operative repair for facial fracture not indicated.  Recommend pain management and follow-up as outpatient in 1 week. No nose blowing or strenuous activity for two weeks.

## 2021-08-09 NOTE — Discharge Instructions (Addendum)
ENT recommends no nose blowing as well as sneezing with an open mouth or strenuous activity for the next 2 weeks.  Please follow-up with ENT within the next week.  You also had a small area of bleeding in your brain that has been stable on repeat imaging.  Please follow-up with neurosurgery.  You are being provided a prescription for opiates (also known as narcotics) for pain control.  Opiates can be addictive and should only be used when absolutely necessary for pain control when other alternatives do not work.  We recommend you only use them for the recommended amount of time and only as prescribed.  Please do not take with other sedative medications or alcohol.  Please do not drive, operate machinery, make important decisions while taking opiates.  Please note that these medications can be addictive and have high abuse potential.  Patients can become addicted to narcotics after only taking them for a few days.  Please keep these medications locked away from children, teenagers or any family members with history of substance abuse.  Narcotic pain medicine may also make you constipated.  You may use over-the-counter medications such as MiraLAX, Colace to prevent constipation.  If you become constipated you may use over-the-counter enemas as needed.  Itching and nausea are common side effects of narcotic pain medication.  If you develop uncontrolled vomiting or a rash, please stop these medications.

## 2021-08-09 NOTE — ED Provider Notes (Signed)
Emergency Medicine Provider Triage Evaluation Note  Edward Welch , a 40 y.o. male with history of seizures was evaluated in triage.  Pt complains of fall last night after drinking too much EtOH, hit his head on hardwood floors.  Questionable loss of consciousness.  Review of Systems  Positive: Head injury Negative: Neck pain, vomiting  Physical Exam  BP 126/85 (BP Location: Left Arm)    Pulse 96    Temp 98.4 F (36.9 C) (Oral)    Resp 18    Ht 6\' 1"  (1.854 m)    Wt 68 kg    SpO2 96%    BMI 19.79 kg/m  Gen:   Awake, no distress   Resp:  Normal effort  MSK:   Moves extremities without difficulty  Other:  Large amount of bruising noted to the left eye and orbit, small lack noted to the left temple  Medical Decision Making  Medically screening exam initiated at 4:33 PM.  Appropriate orders placed.  DEVUN ANNA was informed that the remainder of the evaluation will be completed by another provider, this initial triage assessment does not replace that evaluation, and the importance of remaining in the ED until their evaluation is complete.  Tdap is up-to-date.     Joan Flores, PA-C 08/09/21 1634    08/11/21, MD 08/15/21 [phone]

## 2021-08-09 NOTE — ED Triage Notes (Signed)
Pt states he had more than his usual amount of alcohol to drink last evening before the syncopal episode.

## 2021-08-09 NOTE — Consult Note (Signed)
Referring Physician: Emergency Department  Primary Physician:  Venita Lick, NP  Chief Complaint:  Head Trauma  History of Present Illness: 08/09/2021 Edward Welch is a 40 y.o. male who presents with the chief complaint of head trauma and facial fractures. He was in his usual state of health until approximately 24 hours ago fell forward onto his face. He is unsure if he lost consciousness. He had a mild headache at first, but it improved with tylenol. Has a history of medically controlled seizures without any seizures from this event. He went to the urgent care center the next morning because his left eye had swollen shut. They sent him to the ED for imaging. He denies any fluid leakage or new neurologic signs or symptoms.    Review of Systems:  A 10 point review of systems is negative, except for the pertinent positives and negatives detailed in the HPI.  Past Medical History: Past Medical History:  Diagnosis Date   Anxiety    Broken wrist    Depression    Seizures ([place])     Past Surgical History: History reviewed. No pertinent surgical history.  Allergies: Allergies as of 08/09/2021 - Review Complete 08/09/2021  Allergen Reaction Noted   Amoxil [amoxicillin]  04/02/2015   Penicillins  04/02/2015    Medications: No current facility-administered medications for this encounter.  Current Outpatient Medications:    fluticasone (FLONASE) 50 MCG/ACT nasal spray, SPRAY 2 SPRAYS INTO EACH NOSTRIL EVERY DAY, Disp: 16 mL, Rfl: 4   LamoTRIgine 200 MG TB24 24 hour tablet, Take 2 tablets (400 mg total) by mouth daily., Disp: 180 tablet, Rfl: 4   loratadine (CLARITIN) 10 MG tablet, Take 1 tablet (10 mg total) by mouth daily., Disp: 90 tablet, Rfl: 4   Social History: Social History   Tobacco Use   Smoking status: Every Day    Packs/day: 0.50    Types: Cigarettes   Smokeless tobacco: Former  Substance Use Topics   Alcohol use: Yes    Alcohol/week: 0.0 standard  drinks    Comment: on occasion   Drug use: Yes    Types: Marijuana    Family Medical History: Family History  Problem Relation Age of Onset   Diabetes Mother    Hypertension Father    Seizures Sister    Allergies Daughter    Asthma Son    Heart disease Maternal Grandmother     Physical Examination: Vitals:   08/09/21 1751 08/09/21 1800  BP: 127/84 124/81  Pulse: 73 67  Resp: 20 17  Temp:    SpO2: 99% 99%     General: Patient is well developed, well nourished, calm, collected, and in no apparent distress.  Psychiatric: Patient is non-anxious.  Head:  Right Pupil round, and reactive to light. Left periorbital swelling too profound for left eye examination.   Neck:   Supple.  Full range of motion. No Pain  Respiratory: Patient is breathing without any difficulty.  Extremities: No edema.  Vascular: Palpable pulses in dorsal pedal vessels.  Skin:   On exposed skin, there are no abnormal skin lesions.  NEUROLOGICAL:  General: In no acute distress.   Awake, alert, oriented to person, place, and time. Right pupil round and reactive to light.  Fulle EOMI on right. Left eye unable to be evaluated given periorbital swelling. Facial tone is symmetric within the limitations of swelling.  Tongue protrusion is midline.  There is no pronator drift.   Strength: Side Biceps Triceps Deltoid Interossei  Grip Wrist Ext. Wrist Flex.  R 5 5 5 5 5 5 5   L 5 5 5 5 5 5 5    Side Iliopsoas Quads Hamstring PF DF EHL  R 5 5 5 5 5 5   L 5 5 5 5 5 5     Bilateral upper and lower extremity sensation is intact to light touch.  Clonus is not present.   Imaging: Narrative & Impression  CLINICAL DATA:  Facial trauma after fall last night.   EXAM: CT HEAD WITHOUT CONTRAST   CT MAXILLOFACIAL WITHOUT CONTRAST   TECHNIQUE: Multidetector CT imaging of the head and maxillofacial structures were performed using the standard protocol without intravenous contrast. Multiplanar CT image  reconstructions of the maxillofacial structures were also generated.   COMPARISON:  August 31, 2013.   FINDINGS: CT HEAD FINDINGS   Brain: Stable right temporal and parietal encephalomalacia. No mass effect or midline shift is noted. Ventricular size is within normal limits. There is noted small subdural hematoma anteriorly in the left middle fossa. Stable probable right-sided arachnoid cyst is noted.   Vascular: No hyperdense vessel or unexpected calcification.   Skull: There is noted a mildly displaced fracture involving the anterior and inferior aspect of the left middle cranial fossa.   Other: None.   CT MAXILLOFACIAL FINDINGS   Osseous: Severely displaced and comminuted fractures are seen involving the left anterior and posterior maxillary walls as well as nondisplaced fracture involving left zygomatic arch consistent with tripod fracture. Also noted is mildly displaced left orbital floor fracture.   Orbits: Gas is noted in the inferior portion of the left orbit consistent with left orbital floor fracture as described above.   Sinuses: Large amount of fluid is noted in the left maxillary sinus consistent with hemorrhage secondary to surrounding fractures.   Soft tissues: Large amount of soft tissue gas is seen overlying the left maxillary and periorbital regions consistent with fracture as noted above.   IMPRESSION: Small subdural or possibly epidural hematoma is noted anteriorly in the left middle cranial fossa. There is an associated mildly displaced and comminuted fracture involving the anterior and inferior aspect of the left middle cranial fossa. Critical Value/emergent results were called by telephone at the time of interpretation on 08/09/2021 at 5:27 pm to provider [facility] , who verbally acknowledged these results.   Severely displaced and comminuted fractures are seen involving the left anterior and posterior maxillary walls as well as  nondisplaced fracture involving left zygomatic arch consistent with tripod fracture. Also noted is mildly displaced left orbital floor fracture. Large amount of hemorrhage is noted in left maxillary sinus. Free air is noted in the inferior portion of the left orbit secondary to orbital floor fracture.     Electronically Signed   By: Marijo Conception M.D.   On: 08/09/2021 17:30      I have personally reviewed the images and agree with the above interpretation.  Labs: CBC Latest Ref Rng & Units 08/09/2021 07/25/2021 05/02/2020  WBC 4.0 - 10.5 K/uL 14.1(H) 7.9 7.9  Hemoglobin 13.0 - 17.0 g/dL 16.1 16.9 17.4  Hematocrit 39.0 - 52.0 % 46.8 47.0 49.2  Platelets 150 - 400 K/uL 254 267 242       Assessment and Plan: Mr. Harts is a pleasant 40 y.o. male with a head trauma 24hrs ago. He denies known LOC and states that he had only a very mild headache right after his trauma. Has not had any nausea vomiting or worsening neurological symptoms.  Does have a history of seizures, but did not have any in this setting. Denies any fluid drainage from nose or ears. Is being evaluate by ENT for facial fractures. His exam in nonfocal with the limitations of fully examining his left eye due to periorbital edema. We defer management of his fractures to ENT, however, his middle fossa hemorrhage is not operative given its size and likely stability given his time from initial injury. We ask that you get a repeat head CT and draw labs to evaluate his seizure medication. Should he be therapeutic, he does not require a second agent. In regards to the bleed, should the CT be stable, we can follow up in clinic on an outpatient basis. We remain available for ENT should they need neurosurgical assistance for his fracture management.   Lovenia Kim, MD Neurosurgery

## 2021-08-09 NOTE — ED Provider Notes (Addendum)
[facility] Emergency Department Provider Note   ____________________________________________   Event Date/Time   First MD Initiated Contact with Patient 08/09/21 1715     (approximate)  I have reviewed the triage vital signs and the nursing notes.   HISTORY  Chief Complaint Loss of Consciousness    HPI Edward Welch is a 40 y.o. male for loss of consciousness and facial trauma  LOCATION: Face DURATION: Occurred last evening TIMING: Stable since onset SEVERITY: Severe QUALITY: Swelling to the left aspect of the face and periorbital region CONTEXT: Patient states that he had alcohol intake as well as marijuana use prior to waking up on the floor.  Patient does have a history of seizures but bystanders note patient did not have any seizure activity. MODIFYING FACTORS: Any palpation over this left facial region worsens this pain and it is partially relieved with Tylenol ASSOCIATED SYMPTOMS: Headache   Per medical record review, patient has history of seizures and anxiety          Past Medical History:  Diagnosis Date   Anxiety    Broken wrist    Depression    Seizures (HCC)     Patient Active Problem List   Diagnosis Date Noted   Allergic rhinitis 07/25/2021   Partial symptomatic epilepsy with complex partial seizures, not intractable, without status epilepticus (HCC) 10/12/2017   Cerebral palsy (HCC) 09/21/2017   Nicotine dependence, cigarettes, uncomplicated 04/02/2015    History reviewed. No pertinent surgical history.  Prior to Admission medications   Medication Sig Start Date End Date Taking? Authorizing Provider  fluticasone (FLONASE) 50 MCG/ACT nasal spray SPRAY 2 SPRAYS INTO EACH NOSTRIL EVERY DAY 07/25/21  Yes Cannady, Jolene T, NP  LamoTRIgine 200 MG TB24 24 hour tablet Take 2 tablets (400 mg total) by mouth daily. 07/25/21  Yes Cannady, Jolene T, NP  loratadine (CLARITIN) 10 MG tablet Take 1 tablet (10 mg total) by mouth  daily. 07/25/21  Yes Cannady, Corrie Dandy T, NP    Allergies Amoxil [amoxicillin] and Penicillins  Family History  Problem Relation Age of Onset   Diabetes Mother    Hypertension Father    Seizures Sister    Allergies Daughter    Asthma Son    Heart disease Maternal Grandmother     Social History Social History   Tobacco Use   Smoking status: Every Day    Packs/day: 0.50    Types: Cigarettes   Smokeless tobacco: Former  Substance Use Topics   Alcohol use: Yes    Alcohol/week: 0.0 standard drinks    Comment: on occasion   Drug use: Yes    Types: Marijuana    Review of Systems Constitutional: No fever/chills Eyes: Endorses left eye swollen shut with periorbital swelling ENT: No sore throat. Cardiovascular: Denies chest pain. Respiratory: Denies shortness of breath. Gastrointestinal: No abdominal pain.  No nausea, no vomiting.  No diarrhea. Genitourinary: Negative for dysuria. Musculoskeletal: Negative for acute arthralgias Skin: Endorses small laceration to the left lateral orbit as well as abrasions Neurological: Positive for headaches, negative for weakness/numbness/paresthesias in any extremity Psychiatric: Negative for suicidal ideation/homicidal ideation   ____________________________________________   PHYSICAL EXAM:  VITAL SIGNS: ED Triage Vitals  Enc Vitals Group     BP 08/09/21 1626 126/85     Pulse Rate 08/09/21 1626 96     Resp 08/09/21 1626 18     Temp 08/09/21 1626 98.4 F (36.9 C)     Temp Source 08/09/21 1626 Oral  SpO2 08/09/21 1626 96 %     Weight 08/09/21 1627 150 lb (68 kg)     Height 08/09/21 1627 6\' 1"  (1.854 m)     Head Circumference --      Peak Flow --      Pain Score 08/09/21 1627 3     Pain Loc --      Pain Edu? --      Excl. in GC? --    Constitutional: Alert and oriented. Well appearing and in no acute distress. Eyes: Right eye normal and pupil reactive to light and accommodation.  Left eye significantly swollen but extract  movements intact.  Conjunctival injection Head: Abrasions to the left lateral orbit with a small superficial laceration that is less than 1 cm.  Left periorbital edema and edema as well as ecchymosis over the left zygomatic arch Nose: No congestion/rhinnorhea. Mouth/Throat: Mucous membranes are moist. Neck: No stridor Cardiovascular: Grossly normal heart sounds.  Good peripheral circulation. Respiratory: Normal respiratory effort.  No retractions. Gastrointestinal: Soft and nontender. No distention. Musculoskeletal: No obvious deformities Neurologic:  Normal speech and language. No gross focal neurologic deficits are appreciated. Skin:  Skin is warm and dry. No rash noted. Psychiatric: Mood and affect are normal. Speech and behavior are normal. ____________________________________________   LABS (all labs ordered are listed, but only abnormal results are displayed)  Labs Reviewed  BASIC METABOLIC PANEL - Abnormal; Notable for the following components:      Result Value   Glucose, Bld 124 (*)    All other components within normal limits  CBC - Abnormal; Notable for the following components:   WBC 14.1 (*)    All other components within normal limits  URINALYSIS, ROUTINE W REFLEX MICROSCOPIC - Abnormal; Notable for the following components:   Color, Urine YELLOW (*)    APPearance CLEAR (*)    All other components within normal limits  LAMOTRIGINE LEVEL  CBG MONITORING, ED   ____________________________________________  EKG  ED ECG REPORT I, 08/11/21, the attending physician, personally viewed and interpreted this ECG.  Date: 08/09/2021 EKG Time: 1638 Rate: 82 Rhythm: normal sinus rhythm QRS Axis: normal Intervals: normal ST/T Wave abnormalities: normal Narrative Interpretation: no evidence of acute ischemia  ____________________________________________  RADIOLOGY  ED MD interpretation: CT of the maxillofacial structures without contrast show multiple findings  including severely displaced and comminuted fractures of the left anterior and posterior maxillary walls, nondisplaced fracture of the left zygomatic arch consistent with tripod fracture, mildly displaced left orbital floor fracture  CT head without contrast shows a subdural hematoma in the anterior left middle cranial fossa  Official radiology report(s): CT HEAD WO CONTRAST  Result Date: 08/09/2021 CLINICAL DATA:  Facial trauma after fall last night. EXAM: CT HEAD WITHOUT CONTRAST CT MAXILLOFACIAL WITHOUT CONTRAST TECHNIQUE: Multidetector CT imaging of the head and maxillofacial structures were performed using the standard protocol without intravenous contrast. Multiplanar CT image reconstructions of the maxillofacial structures were also generated. COMPARISON:  August 31, 2013. FINDINGS: CT HEAD FINDINGS Brain: Stable right temporal and parietal encephalomalacia. No mass effect or midline shift is noted. Ventricular size is within normal limits. There is noted small subdural hematoma anteriorly in the left middle fossa. Stable probable right-sided arachnoid cyst is noted. Vascular: No hyperdense vessel or unexpected calcification. Skull: There is noted a mildly displaced fracture involving the anterior and inferior aspect of the left middle cranial fossa. Other: None. CT MAXILLOFACIAL FINDINGS Osseous: Severely displaced and comminuted fractures are seen involving the  left anterior and posterior maxillary walls as well as nondisplaced fracture involving left zygomatic arch consistent with tripod fracture. Also noted is mildly displaced left orbital floor fracture. Orbits: Gas is noted in the inferior portion of the left orbit consistent with left orbital floor fracture as described above. Sinuses: Large amount of fluid is noted in the left maxillary sinus consistent with hemorrhage secondary to surrounding fractures. Soft tissues: Large amount of soft tissue gas is seen overlying the left maxillary and  periorbital regions consistent with fracture as noted above. IMPRESSION: Small subdural or possibly epidural hematoma is noted anteriorly in the left middle cranial fossa. There is an associated mildly displaced and comminuted fracture involving the anterior and inferior aspect of the left middle cranial fossa. Critical Value/emergent results were called by telephone at the time of interpretation on 08/09/2021 at 5:27 pm to provider [facility] , who verbally acknowledged these results. Severely displaced and comminuted fractures are seen involving the left anterior and posterior maxillary walls as well as nondisplaced fracture involving left zygomatic arch consistent with tripod fracture. Also noted is mildly displaced left orbital floor fracture. Large amount of hemorrhage is noted in left maxillary sinus. Free air is noted in the inferior portion of the left orbit secondary to orbital floor fracture. Electronically Signed   By: Lupita Raider M.D.   On: 08/09/2021 17:30   CT Maxillofacial Wo Contrast  Result Date: 08/09/2021 CLINICAL DATA:  Facial trauma after fall last night. EXAM: CT HEAD WITHOUT CONTRAST CT MAXILLOFACIAL WITHOUT CONTRAST TECHNIQUE: Multidetector CT imaging of the head and maxillofacial structures were performed using the standard protocol without intravenous contrast. Multiplanar CT image reconstructions of the maxillofacial structures were also generated. COMPARISON:  August 31, 2013. FINDINGS: CT HEAD FINDINGS Brain: Stable right temporal and parietal encephalomalacia. No mass effect or midline shift is noted. Ventricular size is within normal limits. There is noted small subdural hematoma anteriorly in the left middle fossa. Stable probable right-sided arachnoid cyst is noted. Vascular: No hyperdense vessel or unexpected calcification. Skull: There is noted a mildly displaced fracture involving the anterior and inferior aspect of the left middle cranial fossa. Other: None. CT  MAXILLOFACIAL FINDINGS Osseous: Severely displaced and comminuted fractures are seen involving the left anterior and posterior maxillary walls as well as nondisplaced fracture involving left zygomatic arch consistent with tripod fracture. Also noted is mildly displaced left orbital floor fracture. Orbits: Gas is noted in the inferior portion of the left orbit consistent with left orbital floor fracture as described above. Sinuses: Large amount of fluid is noted in the left maxillary sinus consistent with hemorrhage secondary to surrounding fractures. Soft tissues: Large amount of soft tissue gas is seen overlying the left maxillary and periorbital regions consistent with fracture as noted above. IMPRESSION: Small subdural or possibly epidural hematoma is noted anteriorly in the left middle cranial fossa. There is an associated mildly displaced and comminuted fracture involving the anterior and inferior aspect of the left middle cranial fossa. Critical Value/emergent results were called by telephone at the time of interpretation on 08/09/2021 at 5:27 pm to provider [facility] , who verbally acknowledged these results. Severely displaced and comminuted fractures are seen involving the left anterior and posterior maxillary walls as well as nondisplaced fracture involving left zygomatic arch consistent with tripod fracture. Also noted is mildly displaced left orbital floor fracture. Large amount of hemorrhage is noted in left maxillary sinus. Free air is noted in the inferior portion of the left orbit secondary  to orbital floor fracture. Electronically Signed   By: Lupita Raider M.D.   On: 08/09/2021 17:30    ____________________________________________   PROCEDURES  Procedure(s) performed (including Critical Care):  Procedures   ____________________________________________   INITIAL IMPRESSION / ASSESSMENT AND PLAN / ED COURSE  As part of my medical decision making, I reviewed the following data  within the electronic medical record, if available:  Nursing notes reviewed and incorporated, Labs reviewed, EKG interpreted, Old chart reviewed, Radiograph reviewed and Notes from prior ED visits reviewed and incorporated       Complaining of pain to : Head and left side of face  Given history, exam, and workup, low suspicion for ICH, skull fx, spine fx or other acute spinal syndrome, PTX, pulmonary contusion, cardiac contusion, aortic/vertebral dissection, hollow organ injury, acute traumatic abdomen, significant hemorrhage, extremity fracture.  Workup: Imaging: CT brain, maxillofacial structures show findings as listed above Defer FAST: vitals WNL, no abdominal tenderness or external signs of trauma, non-severe mechanism  Consults: Neurosurgery-recommends repeat head CT in 6 hours if subdural is unchanged will be able to follow-up outpatient as long as patient continues taking his normally prescribed AEDs  ENT-evaluated patient at bedside and recommends no nose blowing as well as sneezing with an open mouth but no antibiotics recommended at this time.  Recommend follow-up at [facility] ENT within the next week  Disposition: Expected transient and self limiting course for pain discussed with patient. Prompt follow up with primary care physician discussed. Patient pending repeat head CT at the time of of handoff   ____________________________________________  FINAL CLINICAL IMPRESSION(S) / ED DIAGNOSES  Final diagnoses:  Syncope and collapse  Closed fracture of left side of maxilla, initial encounter (HCC)  Closed fracture of left zygomatic arch, initial encounter (HCC)  Subdural hematoma     ED Discharge Orders     None        Note:  This document was prepared using Dragon voice recognition software and may include unintentional dictation errors.    Merwyn Katos, MD 08/09/21 1610    Merwyn Katos, MD 08/09/21 [phone]

## 2021-08-10 MED ORDER — HYDROCODONE-ACETAMINOPHEN 5-325 MG PO TABS: 2.0000 | ORAL_TABLET | Freq: Four times a day (QID) | ORAL | 0 refills | Status: DC | PRN

## 2021-08-10 MED ORDER — ONDANSETRON 4 MG PO TBDP: 4.0000 mg | ORAL_TABLET | Freq: Four times a day (QID) | ORAL | 0 refills | Status: DC | PRN

## 2021-08-10 NOTE — ED Provider Notes (Signed)
1:00 AM  Assumed care at shift change.  Patient here after a syncopal event with a small subdural hemorrhage and multiple facial fractures.  Plan was for repeat head CT in 6 hours.  Repeat head CT is stable.  Patient continues to be neurologically intact and in no distress.  Ambulatory and tolerating p.o.  Will give ENT and neurosurgery follow-up information and have discussed return precautions and supportive care instructions.  Will discharge with prescription of pain and nausea medicine.  Patient and family member at bedside are comfortable with this plan.   I reviewed all nursing notes and pertinent previous records as available.  I have reviewed and interpreted any EKGs, lab and urine results, imaging (as available).    Justina Bertini, Delice Bison, DO 08/10/21 [phone]

## 2021-08-12 LAB — LAMOTRIGINE LEVEL: Lamotrigine Lvl: 6.8 ug/mL (ref 2.0–20.0)

## 2021-08-19 ENCOUNTER — Other Ambulatory Visit: Payer: Self-pay | Admitting: Neurosurgery

## 2021-08-19 DIAGNOSIS — S065XAA Traumatic subdural hemorrhage with loss of consciousness status unknown, initial encounter: Secondary | ICD-10-CM

## 2021-09-04 ENCOUNTER — Ambulatory Visit: Payer: Medicaid Other

## 2021-09-16 ENCOUNTER — Ambulatory Visit: Admission: RE | Admit: 2021-09-16 | Payer: Medicaid Other | Source: Ambulatory Visit

## 2021-11-20 ENCOUNTER — Ambulatory Visit: Payer: Medicaid Other

## 2022-02-16 ENCOUNTER — Other Ambulatory Visit: Payer: Self-pay | Admitting: Nurse Practitioner

## 2022-02-16 NOTE — Telephone Encounter (Signed)
Medication Refill - Medication: LamoTRIgine 200 MG TB24 24 hour tablet  Has the patient contacted their pharmacy? Yes.   Pt told to contact provider  Preferred Pharmacy (with phone number or street name):  [id number] - [place], [place] [address] Phone:  [phone]  Fax:  [phone]     Has the patient been seen for an appointment in the last year OR does the patient have an upcoming appointment? Yes.    Agent: Please be advised that RX refills may take up to 3 business days. We ask that you follow-up with your pharmacy.

## 2022-02-17 MED ORDER — LAMOTRIGINE ER 200 MG PO TB24: 2.0000 | ORAL_TABLET | Freq: Every day | ORAL | 0 refills | Status: DC

## 2022-02-17 NOTE — Telephone Encounter (Signed)
Requested Prescriptions  Pending Prescriptions Disp Refills  . LamoTRIgine 200 MG TB24 24 hour tablet 180 tablet 0    Sig: Take 2 tablets (400 mg total) by mouth daily.     Neurology:  Anticonvulsants - lamotrigine Passed - 02/16/2022  3:02 PM      Passed - Cr in normal range and within 360 days    Creatinine, Ser  Date Value Ref Range Status  08/09/2021 0.96 0.61 - 1.24 mg/dL Final         Passed - ALT in normal range and within 360 days    ALT  Date Value Ref Range Status  07/25/2021 15 0 - 44 IU/L Final         Passed - AST in normal range and within 360 days    AST  Date Value Ref Range Status  07/25/2021 18 0 - 40 IU/L Final         Passed - Completed PHQ-2 or PHQ-9 in the last 360 days      Passed - Valid encounter within last 12 months    Recent Outpatient Visits          6 months ago Partial symptomatic epilepsy with complex partial seizures, not intractable, without status epilepticus (HCC)   [facility] [place], [place] T, NP   1 year ago Spastic hemiplegic cerebral palsy (HCC)   [facility] Gabriel Cirri, NP   3 years ago Annual physical exam   [facility] [place], Corrie Dandy T, NP   4 years ago Acute non-recurrent maxillary sinusitis   [facility] Gabriel Cirri, NP   4 years ago Seizure disorder [facility])   [facility] Gabriel Cirri, NP      Future Appointments            In 5 months Cannady, Dorie Rank, NP [facility], [facility]

## 2022-02-21 ENCOUNTER — Other Ambulatory Visit: Payer: Self-pay | Admitting: Nurse Practitioner

## 2022-02-23 NOTE — Telephone Encounter (Signed)
Requested Prescriptions  Pending Prescriptions Disp Refills  . fluticasone (FLONASE) 50 MCG/ACT nasal spray [Pharmacy Med Name: FLUTICASONE PROP 50 MCG SPRAY] 16 mL 1    Sig: SPRAY 2 SPRAYS INTO EACH NOSTRIL EVERY DAY     Ear, Nose, and Throat: Nasal Preparations - Corticosteroids Passed - 02/21/2022  3:07 PM      Passed - Valid encounter within last 12 months    Recent Outpatient Visits          7 months ago Partial symptomatic epilepsy with complex partial seizures, not intractable, without status epilepticus (HCC)   [facility] [place], Jolene T, NP   1 year ago Spastic hemiplegic cerebral palsy (HCC)   [facility] Gabriel Cirri, NP   3 years ago Annual physical exam   [facility] [place], Corrie Dandy T, NP   4 years ago Acute non-recurrent maxillary sinusitis   [facility] Gabriel Cirri, NP   4 years ago Seizure disorder [facility])   [facility] Gabriel Cirri, NP      Future Appointments            In 5 months Cannady, Dorie Rank, NP [facility]

## 2022-03-09 ENCOUNTER — Other Ambulatory Visit: Payer: Self-pay | Admitting: Nurse Practitioner

## 2022-03-10 NOTE — Telephone Encounter (Signed)
Requested medication (s) are due for refill today: no  Requested medication (s) are on the active medication list:yes  Last refill:  02/23/22  Future visit scheduled: yes  Notes to clinic:  Unable to refill per protocol, last refill by another provider 02/23/22, pharmacy request 90 day supply.     Requested Prescriptions  Pending Prescriptions Disp Refills   fluticasone (FLONASE) 50 MCG/ACT nasal spray [Pharmacy Med Name: FLUTICASONE PROP 50 MCG SPRAY] 48 mL 1    Sig: SPRAY 2 SPRAYS INTO EACH NOSTRIL EVERY DAY     Ear, Nose, and Throat: Nasal Preparations - Corticosteroids Passed - 03/09/2022 10:14 AM      Passed - Valid encounter within last 12 months    Recent Outpatient Visits           7 months ago Partial symptomatic epilepsy with complex partial seizures, not intractable, without status epilepticus (HCC)   [facility] [place], Jolene T, NP   1 year ago Spastic hemiplegic cerebral palsy (HCC)   [facility] Gabriel Cirri, NP   3 years ago Annual physical exam   [facility] [place], Corrie Dandy T, NP   4 years ago Acute non-recurrent maxillary sinusitis   [facility] Gabriel Cirri, NP   4 years ago Seizure disorder [facility])   [facility] Gabriel Cirri, NP       Future Appointments             In 4 months Cannady, Dorie Rank, NP [facility]

## 2022-07-26 NOTE — Patient Instructions (Incomplete)

## 2022-07-28 ENCOUNTER — Encounter: Payer: Medicaid Other | Admitting: Nurse Practitioner

## 2022-07-28 DIAGNOSIS — G802 Spastic hemiplegic cerebral palsy: Secondary | ICD-10-CM

## 2022-07-28 DIAGNOSIS — Z0000 Encounter for general adult medical examination without abnormal findings: Secondary | ICD-10-CM

## 2022-07-28 DIAGNOSIS — F17210 Nicotine dependence, cigarettes, uncomplicated: Secondary | ICD-10-CM

## 2022-07-28 DIAGNOSIS — G40209 Localization-related (focal) (partial) symptomatic epilepsy and epileptic syndromes with complex partial seizures, not intractable, without status epilepticus: Secondary | ICD-10-CM

## 2022-07-28 DIAGNOSIS — Z13220 Encounter for screening for lipoid disorders: Secondary | ICD-10-CM

## 2022-08-13 IMAGING — CT CT HEAD W/O CM
4 series · 16 of 47 positions shown, 18 images · non-contrast
Comparison: Prior CT from earlier the same day.

CLINICAL DATA: Follow-up examination for intracranial hemorrhage.

EXAM:
CT HEAD WITHOUT CONTRAST
TECHNIQUE: Contiguous axial images were obtained from the base of the skull
through the vertex without intravenous contrast.

[Series 2: head wo · axial · 0.42mm/px · z∈[+516,+636]mm · 7 of 32 slices shown, 9 images]
[im 4/32  brain]
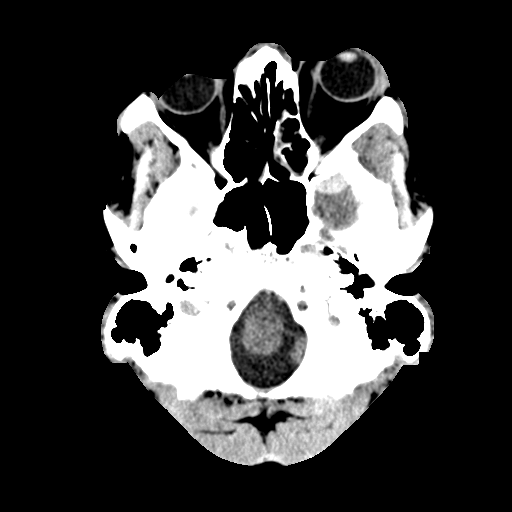
[im 4/32  bone]
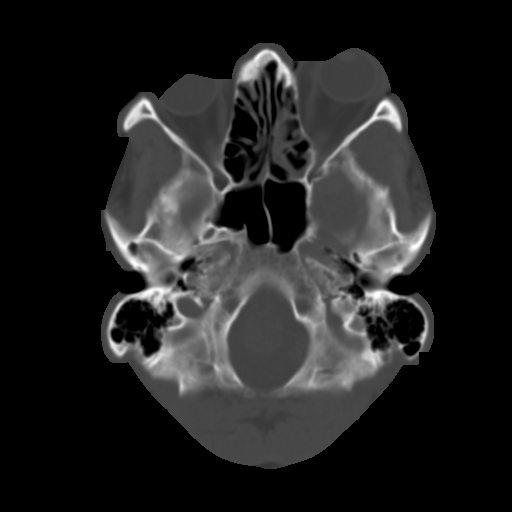
[im 8/32  brain]
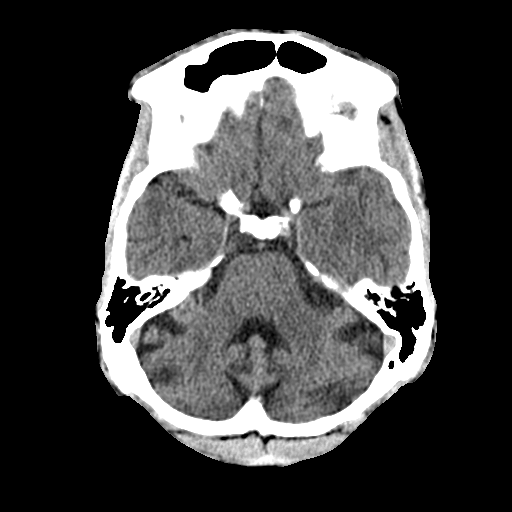
[im 12/32  brain]
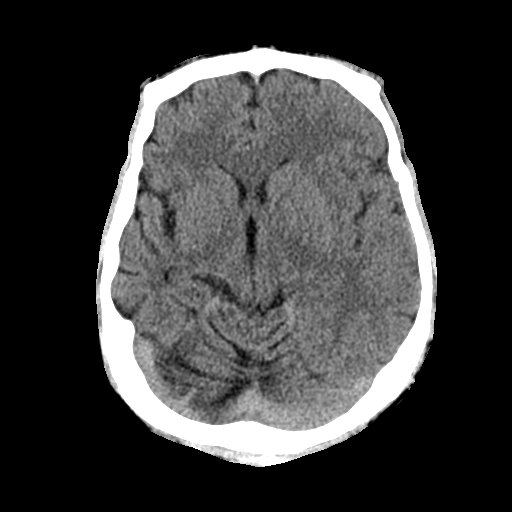
[im 16/32  brain]
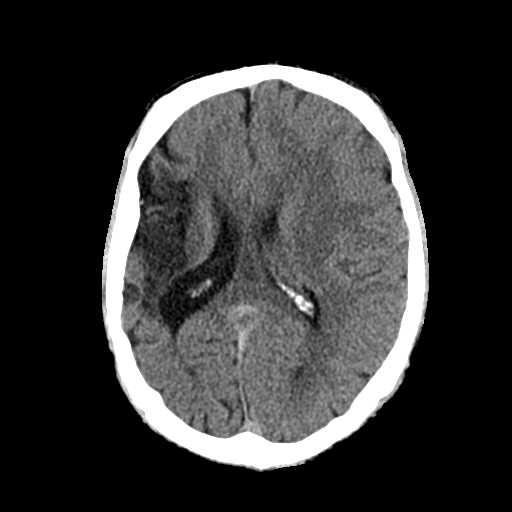
[im 20/32  brain]
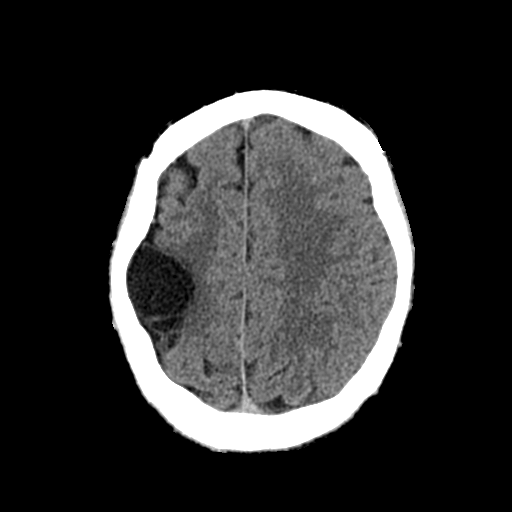
[im 20/32  bone]
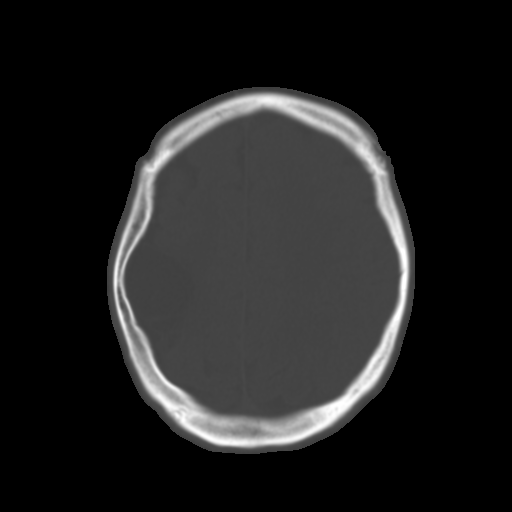
[im 24/32  brain]
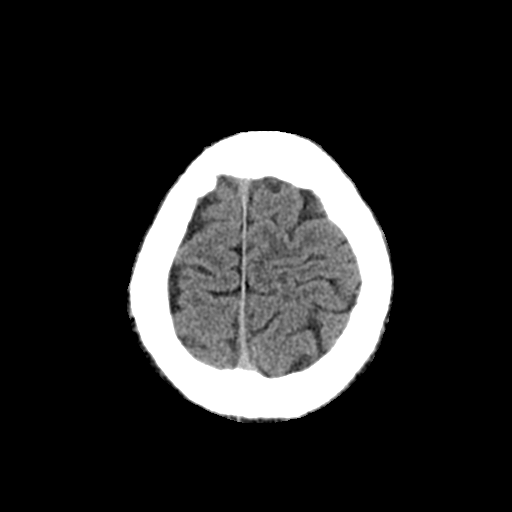
[im 28/32  brain]
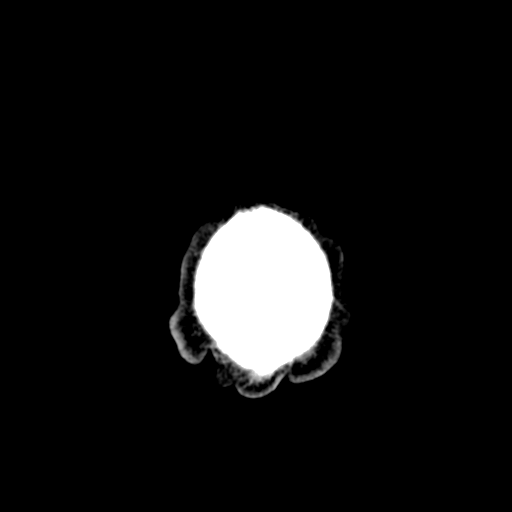

[Series 3: head bone · axial · 0.42mm/px · z∈[+515,+547]mm · 3 of 79 slices shown]
[im 8/79  bone]
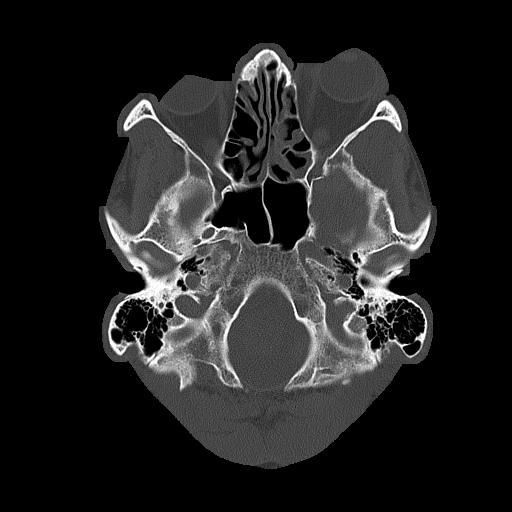
[im 16/79  bone]
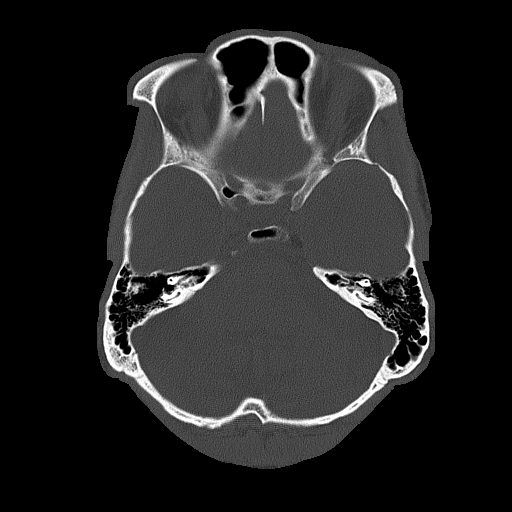
[im 24/79  bone]
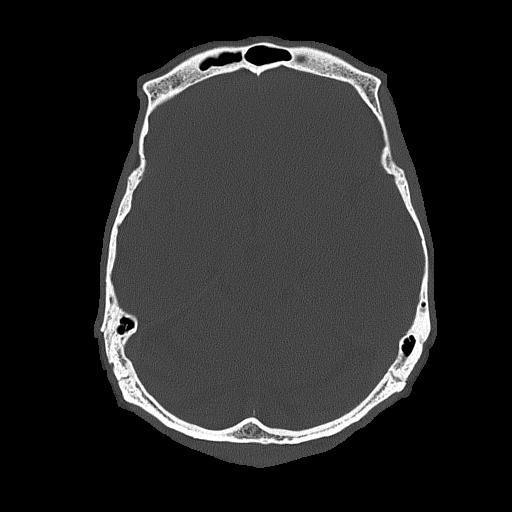

[Series 4: coronal soft tissue · coronal · 0.31mm/px · 3 of 66 slices shown]
[im 22/66  brain]
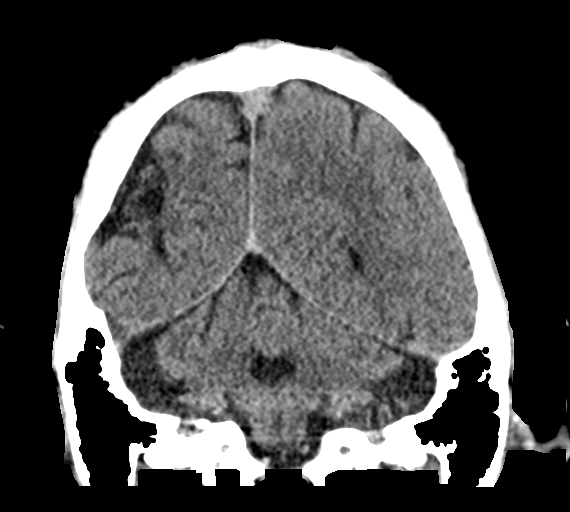
[im 29/66  brain]
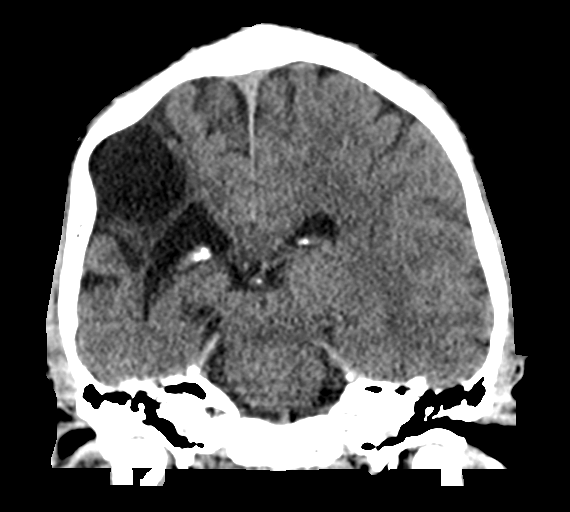
[im 37/66  brain]
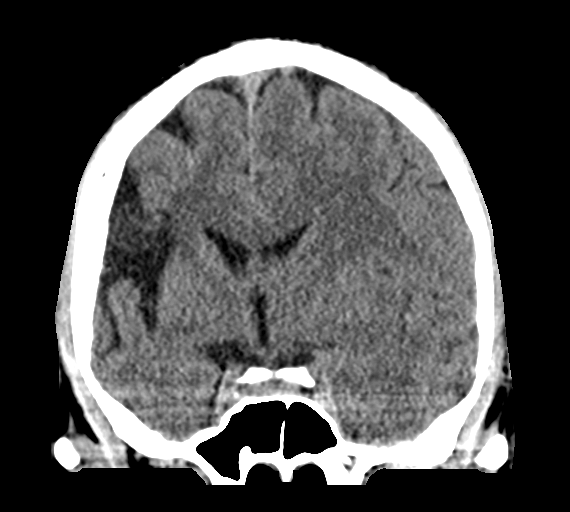

[Series 5: sagittal soft tissue · sagittal · 0.31mm/px · 3 of 56 slices shown]
[im 19/56  brain]
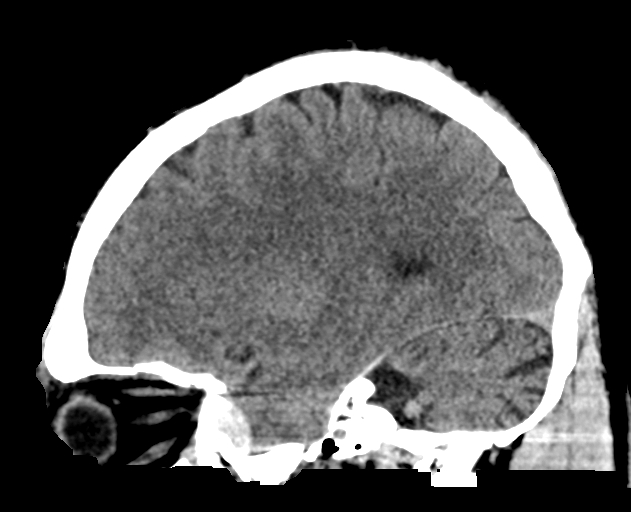
[im 28/56  brain]
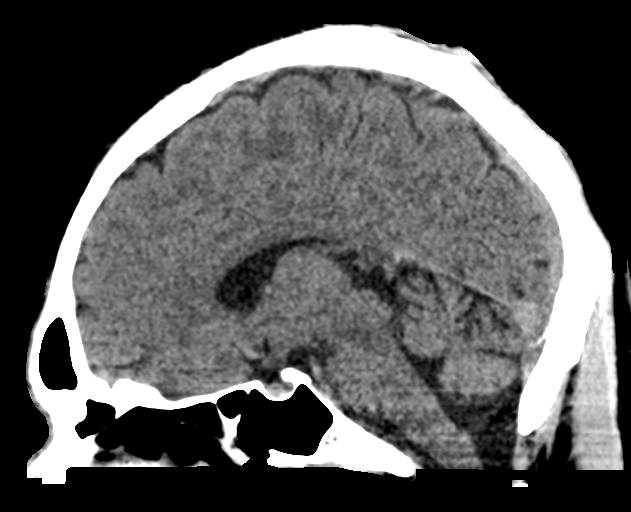
[im 37/56  brain]
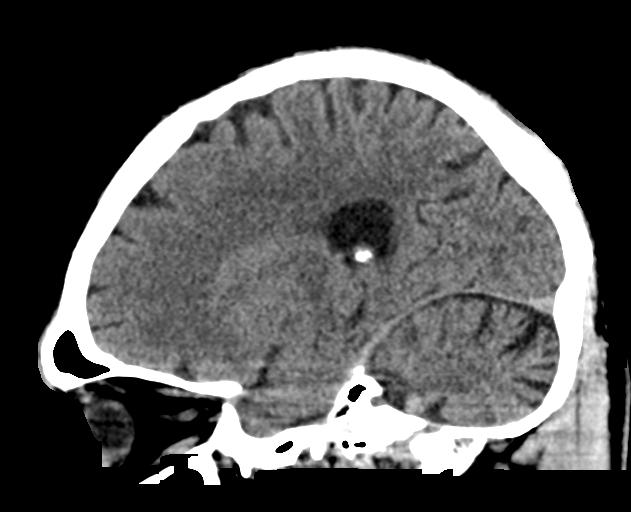

[16 of 47 positions shown; findings below may reference images not displayed]

FINDINGS: Brain: Previously identified extra-axial hemorrhage involving the
left middle cranial fossa again seen, not significantly changed in
size or morphology measuring up to 1 cm in maximal diameter. Mild
mass effect on the subjacent left temporal pole is relatively
similar.

No other new acute intracranial hemorrhage no acute large vessel
territory infarct. Chronic right cerebral atrophy with possible
superimposed arachnoid cyst again noted. Moderately advanced
cerebellar atrophy noted as well. No other mass lesion or midline
shift. No hydrocephalus.

Vascular: No hyperdense vessel.

Skull: Acute calvarial fractures involving the greater wing of the
left sphenoid again noted, stable. Soft tissue contusion involving
the left forehead/periorbital region with superimposed emphysema.

Sinuses/Orbits: Mild left-sided proptosis noted. Globes remain
intact no visible retro-orbital hematoma. Left-sided tripod fracture
with associated soft tissue swelling and emphysema partially
visualized. Scattered blood products noted within the ethmoidal air
cells and visualized left maxillary sinus. Mastoid and middle ear
cavities remain clear.

Other: None.
IMPRESSION: 1. No significant interval change in size and morphology of
extra-axial hemorrhage positioned at the left middle cranial fossa
without significant regional mass effect.
2. No other new acute intracranial abnormality.
3. Acute calvarial fractures involving the greater wing of the left
sphenoid, stable.
4. Left-sided tripod fracture with associated soft tissue swelling
and emphysema, partially visualized, but grossly stable.

## 2022-10-27 ENCOUNTER — Telehealth: Payer: Self-pay | Admitting: Nurse Practitioner

## 2022-10-27 NOTE — Telephone Encounter (Signed)
Patient needs an appt with provider before this can be refilled, has not been seen since 2022

## 2022-10-27 NOTE — Telephone Encounter (Signed)
Pt came in wanting to see if he can get a refill on his Rx fluticasone (FLONASE) 50 MG Pharm: [facility] [address] [address] [employer]

## 2022-10-27 NOTE — Telephone Encounter (Signed)
Hey would the appointment on 11/24/2022 at 1:40 work for the appointment that he needs to make?

## 2022-10-27 NOTE — Telephone Encounter (Signed)
Yes ma'am thank you!

## 2022-10-27 NOTE — Telephone Encounter (Signed)
Okay thank you so much and you are more than welcome.

## 2022-11-21 NOTE — Patient Instructions (Signed)

## 2022-11-24 ENCOUNTER — Ambulatory Visit (INDEPENDENT_AMBULATORY_CARE_PROVIDER_SITE_OTHER): Payer: Medicaid Other | Admitting: Nurse Practitioner

## 2022-11-24 ENCOUNTER — Encounter: Payer: Self-pay | Admitting: Nurse Practitioner

## 2022-11-24 VITALS — BP 130/80 | HR 82 | Temp 97.8°F | Ht 74.02 in | Wt 147.5 lb

## 2022-11-24 DIAGNOSIS — Z136 Encounter for screening for cardiovascular disorders: Secondary | ICD-10-CM | POA: Diagnosis not present

## 2022-11-24 DIAGNOSIS — G802 Spastic hemiplegic cerebral palsy: Secondary | ICD-10-CM

## 2022-11-24 DIAGNOSIS — G40209 Localization-related (focal) (partial) symptomatic epilepsy and epileptic syndromes with complex partial seizures, not intractable, without status epilepticus: Secondary | ICD-10-CM

## 2022-11-24 DIAGNOSIS — Z0000 Encounter for general adult medical examination without abnormal findings: Secondary | ICD-10-CM

## 2022-11-24 DIAGNOSIS — Z13220 Encounter for screening for lipoid disorders: Secondary | ICD-10-CM

## 2022-11-24 DIAGNOSIS — F17210 Nicotine dependence, cigarettes, uncomplicated: Secondary | ICD-10-CM | POA: Diagnosis not present

## 2022-11-24 DIAGNOSIS — J301 Allergic rhinitis due to pollen: Secondary | ICD-10-CM | POA: Diagnosis not present

## 2022-11-24 MED ORDER — FLUTICASONE PROPIONATE 50 MCG/ACT NA SUSP: NASAL | 3 refills | Status: DC

## 2022-11-24 MED ORDER — LAMOTRIGINE ER 200 MG PO TB24: 2.0000 | ORAL_TABLET | Freq: Every day | ORAL | 4 refills | Status: DC

## 2022-11-24 NOTE — Assessment & Plan Note (Signed)
Chronic, ongoing -- last seizure 08/09/22.  Followed by [facility] neurology in past, return as needed.  Continue Lamictal as current dosing and adjust as needed.  Check Lamictal level and kidney function today.  Recommend if further seizure activity we get back into neurology.

## 2022-11-24 NOTE — Assessment & Plan Note (Signed)
Chronic, stable and independent in all ADL's.  Followed by [facility] in past, return as needed. °

## 2022-11-24 NOTE — Progress Notes (Signed)
BP 130/80   Pulse 82   Temp 97.8 F (36.6 C) (Oral)   Ht 6' 2.02" (1.88 m)   Wt 147 lb 8 oz (66.9 kg)   SpO2 98%   BMI 18.93 kg/m    Subjective:    Patient ID: Edward Welch, male    DOB: [birth year], 42 y.o.   MRN: [id number]  HPI: Edward Welch is a 42 y.o. male presenting on 11/24/2022 for comprehensive medical examination. Current medical complaints include:none  He currently lives with: mother Interim Problems from his last visit: no  SEIZURE DISORDER AND CEREBRAL PALSY: Currently followed by [facility] neurology in past and was last seen by Margie Ege NP on 09/20/2018.  They increased his Lamotrigine to 400 MG 24 hour tablet daily.  He reports no seizure activity since 08/09/21 when he obtained a subdural hematoma and facial fractures, prior to this has not had a seizure in >2 years.  He is to return to neurology as needed.  Follows with neurosurgery, for SDH, last visit was 09/18/21.  Continues to smoke, smokes 1/2 PPD.  Started smoking around age 25.  Not interested in quitting at this time.     Functional Status Survey: Is the patient deaf or have difficulty hearing?: No Does the patient have difficulty seeing, even when wearing glasses/contacts?: No Does the patient have difficulty concentrating, remembering, or making decisions?: No Does the patient have difficulty walking or climbing stairs?: No Does the patient have difficulty dressing or bathing?: No Does the patient have difficulty doing errands alone such as visiting a doctor's office or shopping?: No     03/25/2020   11:24 AM 05/02/2020    8:48 AM 07/25/2021   10:37 AM 08/09/2021    4:30 PM 11/24/2022    1:47 PM  Fall Risk  Falls in the past year?  0 0  1  Was there an injury with Fall?  0 0  1  Fall Risk Category Calculator  0 0  3  Fall Risk Category (Retired)  Low Low    (RETIRED) Patient Fall Risk Level Low fall risk Low fall risk Low fall risk Low fall risk   Patient at Risk for Falls Due to   No  Fall Risks  History of fall(s)  Fall risk Follow up  Falls evaluation completed Falls evaluation completed  Falls evaluation completed       11/24/2022    1:47 PM 07/25/2021   10:33 AM 05/02/2020    8:48 AM 09/21/2017    1:44 PM 08/17/2017   10:04 AM  Depression screen PHQ 2/9  Decreased Interest 0 0 0 0 0  Down, Depressed, Hopeless 0 0 0 1 2  PHQ - 2 Score 0 0 0 1 2  Altered sleeping 0 0   0  Tired, decreased energy 0 0  1 0  Change in appetite 0 0  0 0  Feeling bad or failure about yourself  0 0  2 1  Trouble concentrating 0 0  0 0  Moving slowly or fidgety/restless 0 0  1 0  Suicidal thoughts 0 0  0 0  PHQ-9 Score 0 0   3  Difficult doing work/chores  Not difficult at all          11/24/2022    1:48 PM 07/25/2021   10:33 AM  GAD 7 : Generalized Anxiety Score  Nervous, Anxious, on Edge 0 0  Control/stop worrying 3 0  Worry too much - different things  0 1  Trouble relaxing 0 0  Restless 0 0  Easily annoyed or irritable 0 0  Afraid - awful might happen 1 0  Total GAD 7 Score 4 1  Anxiety Difficulty  Not difficult at all   Advanced Directives <no information>  Past Medical History:  Past Medical History:  Diagnosis Date   Anxiety    Broken wrist    Depression    Seizures     Surgical History:  History reviewed. No pertinent surgical history.  Medications:  Current Outpatient Medications on File Prior to Visit  Medication Sig   HYDROcodone-acetaminophen (NORCO/VICODIN) 5-325 MG tablet Take 2 tablets by mouth every 6 (six) hours as needed.   loratadine (CLARITIN) 10 MG tablet Take 1 tablet (10 mg total) by mouth daily.   ondansetron (ZOFRAN-ODT) 4 MG disintegrating tablet Take 1 tablet (4 mg total) by mouth every 6 (six) hours as needed for nausea or vomiting.   No current facility-administered medications on file prior to visit.    Allergies:  Allergies  Allergen Reactions   Amoxil [Amoxicillin]    Penicillins     Social History:  Social History    Socioeconomic History   Marital status: Legally Separated    Spouse name: Not on file   Number of children: Not on file   Years of education: Not on file   Highest education level: Not on file  Occupational History   Not on file  Tobacco Use   Smoking status: Every Day    Packs/day: .5    Types: Cigarettes   Smokeless tobacco: Former  Substance and Sexual Activity   Alcohol use: Yes    Alcohol/week: 0.0 standard drinks of alcohol    Comment: on occasion   Drug use: Yes    Types: Marijuana   Sexual activity: Yes  Other Topics Concern   Not on file  Social History Narrative   Not on file   Social Determinants of Health   Financial Resource Strain: Low Risk  (07/25/2021)   Overall Financial Resource Strain (CARDIA)    Difficulty of Paying Living Expenses: Not hard at all  Food Insecurity: No Food Insecurity (07/25/2021)   Hunger Vital Sign    Worried About Running Out of Food in the Last Year: Never true    Ran Out of Food in the Last Year: Never true  Transportation Needs: No Transportation Needs (07/25/2021)   PRAPARE - Administrator, Civil Service (Medical): No    Lack of Transportation (Non-Medical): No  Physical Activity: Insufficiently Active (07/25/2021)   Exercise Vital Sign    Days of Exercise per Week: 3 days    Minutes of Exercise per Session: 30 min  Stress: No Stress Concern Present (07/25/2021)   [employer] of Occupational Health - Occupational Stress Questionnaire    Feeling of Stress : Not at all  Social Connections: Socially Isolated (07/25/2021)   Social Connection and Isolation Panel [NHANES]    Frequency of Communication with Friends and Family: Three times a week    Frequency of Social Gatherings with Friends and Family: Three times a week    Attends Religious Services: Never    Active Member of Clubs or Organizations: No    Attends Banker Meetings: Never    Marital Status: Never married  Intimate Partner  Violence: Not At Risk (07/25/2021)   Humiliation, Afraid, Rape, and Kick questionnaire    Fear of Current or Ex-Partner: No    Emotionally Abused: No  Physically Abused: No    Sexually Abused: No   Social History   Tobacco Use  Smoking Status Every Day   Packs/day: .5   Types: Cigarettes  Smokeless Tobacco Former   Social History   Substance and Sexual Activity  Alcohol Use Yes   Alcohol/week: 0.0 standard drinks of alcohol   Comment: on occasion    Family History:  Family History  Problem Relation Age of Onset   Diabetes Mother    Hypertension Father    Seizures Sister    Allergies Daughter    Asthma Son    Heart disease Maternal Grandmother     Past medical history, surgical history, medications, allergies, family history and social history reviewed with patient today and changes made to appropriate areas of the chart.   ROS All other ROS negative except what is listed above and in the HPI.      Objective:    BP 130/80   Pulse 82   Temp 97.8 F (36.6 C) (Oral)   Ht 6' 2.02" (1.88 m)   Wt 147 lb 8 oz (66.9 kg)   SpO2 98%   BMI 18.93 kg/m   Wt Readings from Last 3 Encounters:  11/24/22 147 lb 8 oz (66.9 kg)  08/09/21 150 lb (68 kg)  07/25/21 144 lb 6.4 oz (65.5 kg)    Physical Exam Vitals and nursing note reviewed.  Constitutional:      General: He is awake. He is not in acute distress.    Appearance: He is well-developed and well-groomed. He is not ill-appearing or toxic-appearing.  HENT:     Head: Normocephalic and atraumatic.     Right Ear: Hearing, tympanic membrane, ear canal and external ear normal. No drainage.     Left Ear: Hearing, tympanic membrane, ear canal and external ear normal. No drainage.     Nose: Nose normal.     Mouth/Throat:     Pharynx: Uvula midline.  Eyes:     General: Lids are normal.        Right eye: No discharge.        Left eye: No discharge.     Extraocular Movements: Extraocular movements intact.      Conjunctiva/sclera: Conjunctivae normal.     Pupils: Pupils are equal, round, and reactive to light.     Visual Fields: Right eye visual fields normal and left eye visual fields normal.  Neck:     Thyroid: No thyromegaly.     Vascular: No carotid bruit or JVD.     Trachea: Trachea normal.  Cardiovascular:     Rate and Rhythm: Normal rate and regular rhythm.     Heart sounds: Normal heart sounds, S1 normal and S2 normal. No murmur heard.    No gallop.  Pulmonary:     Effort: Pulmonary effort is normal. No accessory muscle usage or respiratory distress.     Breath sounds: Normal breath sounds.  Abdominal:     General: Bowel sounds are normal.     Palpations: Abdomen is soft. There is no hepatomegaly or splenomegaly.     Tenderness: There is no abdominal tenderness.  Musculoskeletal:        General: Normal range of motion.     Cervical back: Normal range of motion and neck supple.     Right lower leg: No edema.     Left lower leg: No edema.  Lymphadenopathy:     Head:     Right side of head: No submental, submandibular,  tonsillar, preauricular or posterior auricular adenopathy.     Left side of head: No submental, submandibular, tonsillar, preauricular or posterior auricular adenopathy.     Cervical: No cervical adenopathy.  Skin:    General: Skin is warm and dry.     Capillary Refill: Capillary refill takes less than 2 seconds.     Findings: No rash.  Neurological:     Mental Status: He is alert and oriented to person, place, and time.     Gait: Gait is intact.     Deep Tendon Reflexes: Reflexes are normal and symmetric.     Reflex Scores:      Brachioradialis reflexes are 2+ on the right side and 2+ on the left side.      Patellar reflexes are 2+ on the right side and 2+ on the left side. Psychiatric:        Attention and Perception: Attention normal.        Mood and Affect: Mood normal.        Speech: Speech normal.        Behavior: Behavior normal. Behavior is cooperative.         Thought Content: Thought content normal.        Cognition and Memory: Cognition normal.    Results for orders placed or performed during the hospital encounter of 08/09/21  Basic metabolic panel  Result Value Ref Range   Sodium 139 135 - 145 mmol/L   Potassium 4.0 3.5 - 5.1 mmol/L   Chloride 102 98 - 111 mmol/L   CO2 30 22 - 32 mmol/L   Glucose, Bld 124 (H) 70 - 99 mg/dL   BUN 6 6 - 20 mg/dL   Creatinine, Ser 1.61 0.61 - 1.24 mg/dL   Calcium 9.9 8.9 - 09.6 mg/dL   GFR, Estimated >04 >54 mL/min   Anion gap 7 5 - 15  CBC  Result Value Ref Range   WBC 14.1 (H) 4.0 - 10.5 K/uL   RBC 4.97 4.22 - 5.81 MIL/uL   Hemoglobin 16.1 13.0 - 17.0 g/dL   HCT 09.8 11.9 - 14.7 %   MCV 94.2 80.0 - 100.0 fL   MCH 32.4 26.0 - 34.0 pg   MCHC 34.4 30.0 - 36.0 g/dL   RDW 82.9 56.2 - 13.0 %   Platelets 254 150 - 400 K/uL   nRBC 0.0 0.0 - 0.2 %  Urinalysis, Routine w reflex microscopic  Result Value Ref Range   Color, Urine YELLOW (A) YELLOW   APPearance CLEAR (A) CLEAR   Specific Gravity, Urine 1.012 1.005 - 1.030   pH 6.0 5.0 - 8.0   Glucose, UA NEGATIVE NEGATIVE mg/dL   Hgb urine dipstick NEGATIVE NEGATIVE   Bilirubin Urine NEGATIVE NEGATIVE   Ketones, ur NEGATIVE NEGATIVE mg/dL   Protein, ur NEGATIVE NEGATIVE mg/dL   Nitrite NEGATIVE NEGATIVE   Leukocytes,Ua NEGATIVE NEGATIVE  Lamotrigine level  Result Value Ref Range   Lamotrigine Lvl 6.8 2.0 - 20.0 ug/mL      Assessment & Plan:   Problem List Items Addressed This Visit       Respiratory   Allergic rhinitis   Relevant Orders   CBC with Differential/Platelet     Nervous and Auditory   Cerebral palsy    Chronic, stable and independent in all ADL's.  Followed by [facility] in past, return as needed.      Relevant Orders   CBC with Differential/Platelet   Comprehensive metabolic panel   TSH  Partial symptomatic epilepsy with complex partial seizures, not intractable, without status epilepticus - Primary     Chronic, ongoing -- last seizure 08/09/22.  Followed by [facility] neurology in past, return as needed.  Continue Lamictal as current dosing and adjust as needed.  Check Lamictal level and kidney function today.  Recommend if further seizure activity we get back into neurology.      Relevant Medications   LamoTRIgine 200 MG TB24 24 hour tablet   Other Relevant Orders   CBC with Differential/Platelet   Comprehensive metabolic panel   Lamotrigine level     Other   Nicotine dependence, cigarettes, uncomplicated    I have recommended complete cessation of tobacco use. I have discussed various options available for assistance with tobacco cessation including over the counter methods (Nicotine gum, patch and lozenges). We also discussed prescription options (Chantix, Nicotine Inhaler / Nasal Spray). The patient is not interested in pursuing any prescription tobacco cessation options at this time.       Other Visit Diagnoses     Encounter for lipid screening for cardiovascular disease       Lipid panel on labs today.   Relevant Orders   Comprehensive metabolic panel   Lipid Panel w/o Chol/HDL Ratio   Encounter for annual physical exam       Annual physical today with labs and health maintenance reviewed, discussed with patient.       Discussed aspirin prophylaxis for myocardial infarction prevention and decision was it was not indicated  LABORATORY TESTING:  Health maintenance labs ordered today as discussed above.   IMMUNIZATIONS:   - Tdap: Tetanus vaccination status reviewed: last tetanus booster within 10 years. - Influenza: Refused - Pneumovax: Not applicable - Prevnar: Not applicable - Zostavax vaccine: Not applicable - COVID vaccinations: refused  SCREENING: - Colonoscopy: Not applicable  Discussed with patient purpose of the colonoscopy is to detect colon cancer at curable precancerous or early stages   - AAA Screening: Not applicable  -Hearing Test: Not applicable   -Spirometry: Not applicable   PATIENT COUNSELING:    Sexuality: Discussed sexually transmitted diseases, partner selection, use of condoms, avoidance of unintended pregnancy  and contraceptive alternatives.   Advised to avoid cigarette smoking.  I discussed with the patient that most people either abstain from alcohol or drink within safe limits (<=14/week and <=4 drinks/occasion for males, <=7/weeks and <= 3 drinks/occasion for females) and that the risk for alcohol disorders and other health effects rises proportionally with the number of drinks per week and how often a drinker exceeds daily limits.  Discussed cessation/primary prevention of drug use and availability of treatment for abuse.   Diet: Encouraged to adjust caloric intake to maintain  or achieve ideal body weight, to reduce intake of dietary saturated fat and total fat, to limit sodium intake by avoiding high sodium foods and not adding table salt, and to maintain adequate dietary potassium and calcium preferably from fresh fruits, vegetables, and low-fat dairy products.    Stressed the importance of regular exercise  Injury prevention: Discussed safety belts, safety helmets, smoke detector, smoking near bedding or upholstery.   Dental health: Discussed importance of regular tooth brushing, flossing, and dental visits.   Follow up plan: NEXT PREVENTATIVE PHYSICAL DUE IN 1 YEAR. Return in about 1 year (around 11/24/2023) for Annual physical.

## 2022-11-24 NOTE — Assessment & Plan Note (Signed)
I have recommended complete cessation of tobacco use. I have discussed various options available for assistance with tobacco cessation including over the counter methods (Nicotine gum, patch and lozenges). We also discussed prescription options (Chantix, Nicotine Inhaler / Nasal Spray). The patient is not interested in pursuing any prescription tobacco cessation options at this time.  

## 2022-11-26 LAB — CBC WITH DIFFERENTIAL/PLATELET
Basophils Absolute: 0.2 10*3/uL (ref 0.0–0.2)
Basos: 2 %
EOS (ABSOLUTE): 0.7 10*3/uL — ABNORMAL HIGH (ref 0.0–0.4)
Eos: 8 %
Hematocrit: 49.8 % (ref 37.5–51.0)
Hemoglobin: 17.4 g/dL (ref 13.0–17.7)
Immature Grans (Abs): 0 10*3/uL (ref 0.0–0.1)
Immature Granulocytes: 0 %
Lymphocytes Absolute: 3.3 10*3/uL — ABNORMAL HIGH (ref 0.7–3.1)
Lymphs: 38 %
MCH: 32 pg (ref 26.6–33.0)
MCHC: 34.9 g/dL (ref 31.5–35.7)
MCV: 92 fL (ref 79–97)
Monocytes Absolute: 0.7 10*3/uL (ref 0.1–0.9)
Monocytes: 8 %
Neutrophils Absolute: 3.7 10*3/uL (ref 1.4–7.0)
Neutrophils: 44 %
Platelets: 273 10*3/uL (ref 150–450)
RBC: 5.43 x10E6/uL (ref 4.14–5.80)
RDW: 11.8 % (ref 11.6–15.4)
WBC: 8.5 10*3/uL (ref 3.4–10.8)

## 2022-11-26 LAB — LIPID PANEL W/O CHOL/HDL RATIO
Cholesterol, Total: 148 mg/dL (ref 100–199)
HDL: 48 mg/dL (ref >39)
LDL Chol Calc (NIH): 84 mg/dL (ref 0–99)
Triglycerides: 84 mg/dL (ref 0–149)
VLDL Cholesterol Cal: 16 mg/dL (ref 5–40)

## 2022-11-26 LAB — COMPREHENSIVE METABOLIC PANEL
ALT: 11 IU/L (ref 0–44)
AST: 14 IU/L (ref 0–40)
Albumin/Globulin Ratio: 1.7 (ref 1.2–2.2)
Albumin: 4.9 g/dL (ref 4.1–5.1)
Alkaline Phosphatase: 132 IU/L — ABNORMAL HIGH (ref 44–121)
BUN/Creatinine Ratio: 10 (ref 9–20)
BUN: 11 mg/dL (ref 6–24)
Bilirubin Total: 0.7 mg/dL (ref 0.0–1.2)
CO2: 26 mmol/L (ref 20–29)
Calcium: 10.4 mg/dL — ABNORMAL HIGH (ref 8.7–10.2)
Chloride: 98 mmol/L (ref 96–106)
Creatinine, Ser: 1.14 mg/dL (ref 0.76–1.27)
Globulin, Total: 2.9 g/dL (ref 1.5–4.5)
Glucose: 64 mg/dL — ABNORMAL LOW (ref 70–99)
Potassium: 4.2 mmol/L (ref 3.5–5.2)
Sodium: 140 mmol/L (ref 134–144)
Total Protein: 7.8 g/dL (ref 6.0–8.5)
eGFR: 83 mL/min/{1.73_m2} (ref >59)

## 2022-11-26 LAB — LAMOTRIGINE LEVEL: Lamotrigine Lvl: 11.7 ug/mL (ref 2.0–20.0)

## 2022-11-26 LAB — TSH: TSH: 0.627 u[IU]/mL (ref 0.450–4.500)

## 2022-11-26 NOTE — Progress Notes (Signed)
Good morning, please let Edward Welch know his labs have returned and overall these remain stable. Kidney and liver function normal and Lamictal level in good range.  Cholesterol levels stable.  Calcium level was mildly elevated, recommend watching calcium intake (milk, cheese, yogurt) and reducing just a little.  Any questions? Keep being stellar!!  Thank you for allowing me to participate in your care.  I appreciate you. Kindest regards, Orval Dortch

## 2023-11-25 ENCOUNTER — Encounter: Payer: Self-pay | Admitting: Nurse Practitioner

## 2023-11-25 DIAGNOSIS — Z79899 Other long term (current) drug therapy: Secondary | ICD-10-CM

## 2023-11-25 DIAGNOSIS — J301 Allergic rhinitis due to pollen: Secondary | ICD-10-CM

## 2023-11-25 DIAGNOSIS — G802 Spastic hemiplegic cerebral palsy: Secondary | ICD-10-CM

## 2023-11-25 DIAGNOSIS — Z13220 Encounter for screening for lipoid disorders: Secondary | ICD-10-CM

## 2023-11-25 DIAGNOSIS — G40209 Localization-related (focal) (partial) symptomatic epilepsy and epileptic syndromes with complex partial seizures, not intractable, without status epilepticus: Secondary | ICD-10-CM

## 2023-11-25 DIAGNOSIS — Z23 Encounter for immunization: Secondary | ICD-10-CM

## 2023-11-25 DIAGNOSIS — Z0000 Encounter for general adult medical examination without abnormal findings: Secondary | ICD-10-CM

## 2023-11-25 DIAGNOSIS — F17210 Nicotine dependence, cigarettes, uncomplicated: Secondary | ICD-10-CM

## 2024-01-30 ENCOUNTER — Other Ambulatory Visit: Payer: Self-pay | Admitting: Nurse Practitioner

## 2024-02-01 NOTE — Telephone Encounter (Signed)
 Requested medication (s) are due for refill today: yes   Requested medication (s) are on the active medication list: yes   Last refill:  lamotrigine - 11/24/22 #180 4 refills, flonase - 11/24/22 #48 ml 3 refills   Future visit scheduled: no   Notes to clinic:  protocol failed last labs 11/24/22. Expired date on medications do you want to renew / refill Rxs?     Requested Prescriptions  Pending Prescriptions Disp Refills   LamoTRIgine  200 MG TB24 24 hour tablet [Pharmacy Med Name: LAMOTRIGINE  ER 200 MG TABLET] 180 tablet 4    Sig: TAKE 2 TABLETS BY MOUTH EVERY DAY     Neurology:  Anticonvulsants - lamotrigine  Failed - 02/01/2024  1:32 PM      Failed - Cr in normal range and within 360 days    Creatinine, Ser  Date Value Ref Range Status  11/24/2022 1.14 0.76 - 1.27 mg/dL Final         Failed - ALT in normal range and within 360 days    ALT  Date Value Ref Range Status  11/24/2022 11 0 - 44 IU/L Final         Failed - AST in normal range and within 360 days    AST  Date Value Ref Range Status  11/24/2022 14 0 - 40 IU/L Final         Failed - Completed PHQ-2 or PHQ-9 in the last 360 days      Failed - Valid encounter within last 12 months    Recent Outpatient Visits   None             fluticasone  (FLONASE ) 50 MCG/ACT nasal spray [Pharmacy Med Name: FLUTICASONE  PROP 50 MCG SPRAY] 48 mL 3    Sig: SPRAY 2 SPRAYS INTO EACH NOSTRIL EVERY DAY     Ear, Nose, and Throat: Nasal Preparations - Corticosteroids Failed - 02/01/2024  1:32 PM      Failed - Valid encounter within last 12 months    Recent Outpatient Visits   None

## 2024-02-01 NOTE — Telephone Encounter (Signed)
 Patient is overdue for an appointment. Has not been seen in over a year. Please call to schedule and then route to the provider for a possible courtesy refill.

## 2024-02-02 NOTE — Telephone Encounter (Signed)
 Tried to call to get appointment scheduled, but the number we have on file is not working.

## 2024-02-07 ENCOUNTER — Telehealth: Payer: Self-pay | Admitting: Nurse Practitioner

## 2024-02-07 NOTE — Telephone Encounter (Signed)
 Attempted to contact mother Rhoda (DPR verified). Medication refill request is pending for fill as patient has not been seen in over a year and patients must be seen at least yearly if wishing to continue medications.

## 2024-02-07 NOTE — Telephone Encounter (Unsigned)
 Copied from CRM [id number]. Topic: Clinical - Medication Refill >> Feb 07, 2024 10:34 AM Tiffini S wrote: Medication:  LamoTRIgine  200 MG TB24 24 hour tablet   Has the patient contacted their pharmacy? Yes, patient mother [facility] two weeks ago to request refill/ states that the pharmacy haven't heard back from office   This is the patient's preferred pharmacy:  [id number] [place], [place] - [address] [place] [address] Phone: [phone] Fax: [phone]  Is this the correct pharmacy for this prescription? Yes If no, delete pharmacy and type the correct one.   Has the prescription been filled recently? Yes  Is the patient out of the medication? Yes, patient only have two tablets left that he will take today and will be completely out for seizures   Has the patient been seen for an appointment in the last year OR does the patient have an upcoming appointment? Yes  Can we respond through MyChart? No, please call mother Rhoda Batter at [phone]/ patient mother declined MyCHART at this time.   Agent: Please be advised that Rx refills may take up to 3 business days. We ask that you follow-up with your pharmacy.

## 2024-02-07 NOTE — Telephone Encounter (Signed)
 Appt scheduled 02/09/24

## 2024-02-09 ENCOUNTER — Encounter: Payer: Self-pay | Admitting: Nurse Practitioner

## 2024-02-09 ENCOUNTER — Ambulatory Visit: Admitting: Nurse Practitioner

## 2024-02-09 VITALS — BP 99/64 | HR 77 | Temp 98.1°F | Ht 74.0 in | Wt 144.8 lb

## 2024-02-09 DIAGNOSIS — Z13220 Encounter for screening for lipoid disorders: Secondary | ICD-10-CM

## 2024-02-09 DIAGNOSIS — G802 Spastic hemiplegic cerebral palsy: Secondary | ICD-10-CM | POA: Diagnosis not present

## 2024-02-09 DIAGNOSIS — Z136 Encounter for screening for cardiovascular disorders: Secondary | ICD-10-CM

## 2024-02-09 DIAGNOSIS — Z0000 Encounter for general adult medical examination without abnormal findings: Secondary | ICD-10-CM

## 2024-02-09 DIAGNOSIS — G40209 Localization-related (focal) (partial) symptomatic epilepsy and epileptic syndromes with complex partial seizures, not intractable, without status epilepticus: Secondary | ICD-10-CM

## 2024-02-09 DIAGNOSIS — F17210 Nicotine dependence, cigarettes, uncomplicated: Secondary | ICD-10-CM | POA: Diagnosis not present

## 2024-02-09 NOTE — Progress Notes (Signed)
 BP 99/64   Pulse 77   Temp 98.1 F (36.7 C) (Oral)   Ht 6' 2 (1.88 m)   Wt 144 lb 12.8 oz (65.7 kg)   SpO2 98%   BMI 18.59 kg/m    Subjective:    Patient ID: Edward Welch, male    DOB: 1981-03-28, 43 y.o.   MRN: [id number]  HPI: Edward Welch is a 43 y.o. male presenting on 02/09/2024 for comprehensive medical examination. Current medical complaints include:none  He currently lives with: mother Interim Problems from his last visit: no  SEIZURE DISORDER AND CEREBRAL PALSY: Was followed by [facility] neurology in past, last seen by Lauraine Born NP on 09/18/2021. Continues Lamotrigine  400 MG 24 hour tablet daily.  He reports no seizure activity since 08/09/21 when he obtained a subdural hematoma and facial fractures, prior to this has not had a seizure in >2 years.  He is to return to neurology as needed.  No recent seizures.  Continues to smoke, smokes 1/2 PPD.  Started smoking around age 54.  Not interested in quitting at this time.     Functional Status Survey: Is the patient deaf or have difficulty hearing?: No Does the patient have difficulty seeing, even when wearing glasses/contacts?: No Does the patient have difficulty concentrating, remembering, or making decisions?: No Does the patient have difficulty walking or climbing stairs?: No Does the patient have difficulty dressing or bathing?: No Does the patient have difficulty doing errands alone such as visiting a doctor's office or shopping?: No     05/02/2020    8:48 AM 07/25/2021   10:37 AM 08/09/2021    4:30 PM 11/24/2022    1:47 PM 02/09/2024    3:46 PM  Fall Risk  Falls in the past year? 0 0  1 0  Was there an injury with Fall? 0 0  1 0  Fall Risk Category Calculator 0 0  3 0  Fall Risk Category (Retired) Low  Low      (RETIRED) Patient Fall Risk Level Low fall risk  Low fall risk  Low fall risk     Patient at Risk for Falls Due to  No Fall Risks  History of fall(s) No Fall Risks  Fall risk Follow up Falls  evaluation completed  Falls evaluation completed   Falls evaluation completed Falls evaluation completed     Data saved with a previous flowsheet row definition       02/09/2024    3:47 PM 11/24/2022    1:47 PM 07/25/2021   10:33 AM 05/02/2020    8:48 AM 09/21/2017    1:44 PM  Depression screen PHQ 2/9  Decreased Interest 0 0 0 0 0  Down, Depressed, Hopeless 0 0 0 0 1  PHQ - 2 Score 0 0 0 0 1  Altered sleeping 0 0 0    Tired, decreased energy 0 0 0  1  Change in appetite 0 0 0  0  Feeling bad or failure about yourself  1 0 0  2  Trouble concentrating 0 0 0  0  Moving slowly or fidgety/restless 0 0 0  1  Suicidal thoughts 0 0 0  0  PHQ-9 Score 1 0 0    Difficult doing work/chores Somewhat difficult  Not difficult at all         02/09/2024    3:47 PM 11/24/2022    1:48 PM 07/25/2021   10:33 AM  GAD 7 : Generalized Anxiety Score  Nervous, Anxious, on Edge 0 0 0  Control/stop worrying 1 3 0  Worry too much - different things 2 0 1  Trouble relaxing 0 0 0  Restless 0 0 0  Easily annoyed or irritable 0 0 0  Afraid - awful might happen 0 1 0  Total GAD 7 Score 3 4 1   Anxiety Difficulty Somewhat difficult  Not difficult at all   Advanced Directives <no information>  Past Medical History:  Past Medical History:  Diagnosis Date   Anxiety    Broken wrist    Depression    Seizures (HCC)     Surgical History:  History reviewed. No pertinent surgical history.  Medications:  Current Outpatient Medications on File Prior to Visit  Medication Sig   fluticasone  (FLONASE ) 50 MCG/ACT nasal spray SPRAY 2 SPRAYS INTO EACH NOSTRIL EVERY DAY   LamoTRIgine  200 MG TB24 24 hour tablet TAKE 2 TABLETS BY MOUTH EVERY DAY   No current facility-administered medications on file prior to visit.    Allergies:  Allergies  Allergen Reactions   Amoxil [Amoxicillin]    Penicillins     Social History:  Social History   Socioeconomic History   Marital status: Legally Separated    Spouse  name: Not on file   Number of children: Not on file   Years of education: Not on file   Highest education level: Not on file  Occupational History   Not on file  Tobacco Use   Smoking status: Every Day    Current packs/day: 0.50    Types: Cigarettes   Smokeless tobacco: Former  Advertising account planner   Vaping status: Never Used  Substance and Sexual Activity   Alcohol use: Yes    Alcohol/week: 0.0 standard drinks of alcohol    Comment: on occasion   Drug use: Yes    Types: Marijuana   Sexual activity: Yes  Other Topics Concern   Not on file  Social History Narrative   Not on file   Social Drivers of Health   Financial Resource Strain: Low Risk  (07/25/2021)   Overall Financial Resource Strain (CARDIA)    Difficulty of Paying Living Expenses: Not hard at all  Food Insecurity: No Food Insecurity (07/25/2021)   Hunger Vital Sign    Worried About Running Out of Food in the Last Year: Never true    Ran Out of Food in the Last Year: Never true  Transportation Needs: No Transportation Needs (07/25/2021)   PRAPARE - Administrator, Civil Service (Medical): No    Lack of Transportation (Non-Medical): No  Physical Activity: Insufficiently Active (07/25/2021)   Exercise Vital Sign    Days of Exercise per Week: 3 days    Minutes of Exercise per Session: 30 min  Stress: No Stress Concern Present (07/25/2021)   [employer] of Occupational Health - Occupational Stress Questionnaire    Feeling of Stress : Not at all  Social Connections: Socially Isolated (07/25/2021)   Social Connection and Isolation Panel    Frequency of Communication with Friends and Family: Three times a week    Frequency of Social Gatherings with Friends and Family: Three times a week    Attends Religious Services: Never    Active Member of Clubs or Organizations: No    Attends Banker Meetings: Never    Marital Status: Never married  Intimate Partner Violence: Not At Risk (07/25/2021)    Humiliation, Afraid, Rape, and Kick questionnaire    Fear of  Current or Ex-Partner: No    Emotionally Abused: No    Physically Abused: No    Sexually Abused: No   Social History   Tobacco Use  Smoking Status Every Day   Current packs/day: 0.50   Types: Cigarettes  Smokeless Tobacco Former   Social History   Substance and Sexual Activity  Alcohol Use Yes   Alcohol/week: 0.0 standard drinks of alcohol   Comment: on occasion    Family History:  Family History  Problem Relation Age of Onset   Diabetes Mother    Hypertension Father    Seizures Sister    Allergies Daughter    Asthma Son    Heart disease Maternal Grandmother     Past medical history, surgical history, medications, allergies, family history and social history reviewed with patient today and changes made to appropriate areas of the chart.   ROS All other ROS negative except what is listed above and in the HPI.      Objective:    BP 99/64   Pulse 77   Temp 98.1 F (36.7 C) (Oral)   Ht 6' 2 (1.88 m)   Wt 144 lb 12.8 oz (65.7 kg)   SpO2 98%   BMI 18.59 kg/m   Wt Readings from Last 3 Encounters:  02/09/24 144 lb 12.8 oz (65.7 kg)  11/24/22 147 lb 8 oz (66.9 kg)  08/09/21 150 lb (68 kg)    Physical Exam Vitals and nursing note reviewed.  Constitutional:      General: He is awake. He is not in acute distress.    Appearance: He is well-developed and well-groomed. He is not ill-appearing or toxic-appearing.  HENT:     Head: Normocephalic and atraumatic.     Right Ear: Hearing, tympanic membrane, ear canal and external ear normal. No drainage.     Left Ear: Hearing, tympanic membrane, ear canal and external ear normal. No drainage.     Nose: Nose normal.     Mouth/Throat:     Pharynx: Uvula midline.  Eyes:     General: Lids are normal.        Right eye: No discharge.        Left eye: No discharge.     Extraocular Movements: Extraocular movements intact.     Conjunctiva/sclera: Conjunctivae  normal.     Pupils: Pupils are equal, round, and reactive to light.     Visual Fields: Right eye visual fields normal and left eye visual fields normal.  Neck:     Thyroid: No thyromegaly.     Vascular: No carotid bruit or JVD.     Trachea: Trachea normal.  Cardiovascular:     Rate and Rhythm: Normal rate and regular rhythm.     Heart sounds: Normal heart sounds, S1 normal and S2 normal. No murmur heard.    No gallop.  Pulmonary:     Effort: Pulmonary effort is normal. No accessory muscle usage or respiratory distress.     Breath sounds: Normal breath sounds.  Abdominal:     General: Bowel sounds are normal.     Palpations: Abdomen is soft. There is no hepatomegaly or splenomegaly.     Tenderness: There is no abdominal tenderness.  Musculoskeletal:        General: Normal range of motion.     Cervical back: Normal range of motion and neck supple.     Right lower leg: No edema.     Left lower leg: No edema.  Lymphadenopathy:  Head:     Right side of head: No submental, submandibular, tonsillar, preauricular or posterior auricular adenopathy.     Left side of head: No submental, submandibular, tonsillar, preauricular or posterior auricular adenopathy.     Cervical: No cervical adenopathy.  Skin:    General: Skin is warm and dry.     Capillary Refill: Capillary refill takes less than 2 seconds.     Findings: No rash.  Neurological:     Mental Status: He is alert and oriented to person, place, and time.     Gait: Gait is intact.     Deep Tendon Reflexes: Reflexes are normal and symmetric.     Reflex Scores:      Brachioradialis reflexes are 2+ on the right side and 2+ on the left side.      Patellar reflexes are 2+ on the right side and 2+ on the left side. Psychiatric:        Attention and Perception: Attention normal.        Mood and Affect: Mood normal.        Speech: Speech normal.        Behavior: Behavior normal. Behavior is cooperative.        Thought Content: Thought  content normal.        Cognition and Memory: Cognition normal.    Results for orders placed or performed in visit on 11/24/22  CBC with Differential/Platelet   Collection Time: 11/24/22  2:04 PM  Result Value Ref Range   WBC 8.5 3.4 - 10.8 x10E3/uL   RBC 5.43 4.14 - 5.80 x10E6/uL   Hemoglobin 17.4 13.0 - 17.7 g/dL   Hematocrit 50.1 62.4 - 51.0 %   MCV 92 79 - 97 fL   MCH 32.0 26.6 - 33.0 pg   MCHC 34.9 31.5 - 35.7 g/dL   RDW 88.1 88.3 - 84.5 %   Platelets 273 150 - 450 x10E3/uL   Neutrophils 44 Not Estab. %   Lymphs 38 Not Estab. %   Monocytes 8 Not Estab. %   Eos 8 Not Estab. %   Basos 2 Not Estab. %   Neutrophils Absolute 3.7 1.4 - 7.0 x10E3/uL   Lymphocytes Absolute 3.3 (H) 0.7 - 3.1 x10E3/uL   Monocytes Absolute 0.7 0.1 - 0.9 x10E3/uL   EOS (ABSOLUTE) 0.7 (H) 0.0 - 0.4 x10E3/uL   Basophils Absolute 0.2 0.0 - 0.2 x10E3/uL   Immature Granulocytes 0 Not Estab. %   Immature Grans (Abs) 0.0 0.0 - 0.1 x10E3/uL  Comprehensive metabolic panel   Collection Time: 11/24/22  2:04 PM  Result Value Ref Range   Glucose 64 (L) 70 - 99 mg/dL   BUN 11 6 - 24 mg/dL   Creatinine, Ser 8.85 0.76 - 1.27 mg/dL   eGFR 83 >40 fO/fpw/8.26   BUN/Creatinine Ratio 10 9 - 20   Sodium 140 134 - 144 mmol/L   Potassium 4.2 3.5 - 5.2 mmol/L   Chloride 98 96 - 106 mmol/L   CO2 26 20 - 29 mmol/L   Calcium 10.4 (H) 8.7 - 10.2 mg/dL   Total Protein 7.8 6.0 - 8.5 g/dL   Albumin 4.9 4.1 - 5.1 g/dL   Globulin, Total 2.9 1.5 - 4.5 g/dL   Albumin/Globulin Ratio 1.7 1.2 - 2.2   Bilirubin Total 0.7 0.0 - 1.2 mg/dL   Alkaline Phosphatase 132 (H) 44 - 121 IU/L   AST 14 0 - 40 IU/L   ALT 11 0 - 44 IU/L  Lipid  Panel w/o Chol/HDL Ratio   Collection Time: 11/24/22  2:04 PM  Result Value Ref Range   Cholesterol, Total 148 100 - 199 mg/dL   Triglycerides 84 0 - 149 mg/dL   HDL 48 >60 mg/dL   VLDL Cholesterol Cal 16 5 - 40 mg/dL   LDL Chol Calc (NIH) 84 0 - 99 mg/dL  TSH   Collection Time: 11/24/22  2:04 PM   Result Value Ref Range   TSH 0.627 0.450 - 4.500 uIU/mL  Lamotrigine  level   Collection Time: 11/24/22  2:04 PM  Result Value Ref Range   Lamotrigine  Lvl 11.7 2.0 - 20.0 ug/mL      Assessment & Plan:   Problem List Items Addressed This Visit       Nervous and Auditory   Partial symptomatic epilepsy with complex partial seizures, not intractable, without status epilepticus (HCC)   Chronic, ongoing -- last seizure 08/09/21.  Followed by [facility] neurology in past, return as needed.  Continue Lamictal  at current dosing and adjust as needed.  Check Lamictal  level and kidney function today.  Recommend if further seizure activity we get back into neurology.      Relevant Orders   CBC with Differential/Platelet   Comprehensive metabolic panel with GFR   TSH   Lamotrigine  level   Cerebral palsy (HCC) - Primary   Chronic, stable and independent in all ADL's.  Followed by [facility]rology in past, return as needed.      Relevant Orders   CBC with Differential/Platelet   Comprehensive metabolic panel with GFR     Other   Nicotine dependence, cigarettes, uncomplicated   I have recommended complete cessation of tobacco use. I have discussed various options available for assistance with tobacco cessation including over the counter methods (Nicotine gum, patch and lozenges). We also discussed prescription options (Chantix, Nicotine Inhaler / Nasal Spray). The patient is not interested in pursuing any prescription tobacco cessation options at this time.       Other Visit Diagnoses       Encounter for lipid screening for cardiovascular disease       Lipid panel on labs today   Relevant Orders   Comprehensive metabolic panel with GFR   Lipid Panel w/o Chol/HDL Ratio     Encounter for annual physical exam       Annual physical today and health maintenance reviewed.        Discussed aspirin prophylaxis for myocardial infarction prevention and decision was it was not  indicated  LABORATORY TESTING:  Health maintenance labs ordered today as discussed above.   IMMUNIZATIONS:   - Tdap: Tetanus vaccination status reviewed: last tetanus booster within 10 years. - Influenza: Refused - Pneumovax: Not applicable - Prevnar: Not applicable - Zostavax vaccine: Not applicable - COVID vaccinations: refused  SCREENING: - Colonoscopy: Not applicable  Discussed with patient purpose of the colonoscopy is to detect colon cancer at curable precancerous or early stages   - AAA Screening: Not applicable  -Hearing Test: Not applicable  -Spirometry: Not applicable   PATIENT COUNSELING:    Sexuality: Discussed sexually transmitted diseases, partner selection, use of condoms, avoidance of unintended pregnancy  and contraceptive alternatives.   Advised to avoid cigarette smoking.  I discussed with the patient that most people either abstain from alcohol or drink within safe limits (<=14/week and <=4 drinks/occasion for males, <=7/weeks and <= 3 drinks/occasion for females) and that the risk for alcohol disorders and other health effects rises proportionally with the number  of drinks per week and how often a drinker exceeds daily limits.  Discussed cessation/primary prevention of drug use and availability of treatment for abuse.   Diet: Encouraged to adjust caloric intake to maintain  or achieve ideal body weight, to reduce intake of dietary saturated fat and total fat, to limit sodium intake by avoiding high sodium foods and not adding table salt, and to maintain adequate dietary potassium and calcium preferably from fresh fruits, vegetables, and low-fat dairy products.    Stressed the importance of regular exercise  Injury prevention: Discussed safety belts, safety helmets, smoke detector, smoking near bedding or upholstery.   Dental health: Discussed importance of regular tooth brushing, flossing, and dental visits.   Follow up plan: NEXT PREVENTATIVE PHYSICAL DUE  IN 1 YEAR. Return in about 1 year (around 02/08/2025) for Annual Physical.

## 2024-02-09 NOTE — Assessment & Plan Note (Signed)
Chronic, stable and independent in all ADL's.  Followed by [facility] in past, return as needed. °

## 2024-02-09 NOTE — Assessment & Plan Note (Signed)
 I have recommended complete cessation of tobacco use. I have discussed various options available for assistance with tobacco cessation including over the counter methods (Nicotine gum, patch and lozenges). We also discussed prescription options (Chantix, Nicotine Inhaler / Nasal Spray). The patient is not interested in pursuing any prescription tobacco cessation options at this time.

## 2024-02-09 NOTE — Assessment & Plan Note (Signed)
 Chronic, ongoing -- last seizure 08/09/21.  Followed by [facility] neurology in past, return as needed.  Continue Lamictal  at current dosing and adjust as needed.  Check Lamictal  level and kidney function today.  Recommend if further seizure activity we get back into neurology.

## 2024-02-09 NOTE — Patient Instructions (Signed)
 Be Involved in Caring For Your Health:  Taking Medications When medications are taken as directed, they can greatly improve your health. But if they are not taken as prescribed, they may not work. In some cases, not taking them correctly can be harmful. To help ensure your treatment remains effective and safe, understand your medications and how to take them. Bring your medications to each visit for review by your provider.  Your lab results, notes, and after visit summary will be available on My Chart. We strongly encourage you to use this feature. If lab results are abnormal the clinic will contact you with the appropriate steps. If the clinic does not contact you assume the results are satisfactory. You can always view your results on My Chart. If you have questions regarding your health or results, please contact the clinic during office hours. You can also ask questions on My Chart.  We at [facility] are grateful that you chose us  to provide your care. We strive to provide evidence-based and compassionate care and are always looking for feedback. If you get a survey from the clinic please complete this so we can hear your opinions.  Seizure, Adult A seizure is a sudden burst of abnormal activity in the brain. Seizures usually last from 30 seconds to 2 minutes. There are many types of seizures. And they can cause many different symptoms. What are the causes? Common causes of a seizure include: Fever or infection. Problems that affect the brain. These may include: A brain or head injury. A stroke. A brain tumor. Low levels of blood sugar or salt. Kidney problems or liver problems. Some inherited conditions. These are passed down from parent to child. Problems with a substance, such as: Having a reaction to a drug or a medicine. Stopping the use of a substance all of a sudden. When this causes problems, it's called withdrawal. Disorders that affect how you develop, such as  autism spectrum disorder or cerebral palsy. Sometimes, the cause may not be known. Some people who have a seizure never have another one. A person who has repeated seizures over time without a clear cause has a condition called epilepsy. What increases the risk? Having a family history of epilepsy. Having had a tonic-clonic seizure before. This type of seizure causes: The muscles of the whole body to tighten, or contract. Loss of consciousness. Having a head injury or a stroke in the past. Having had too little oxygen at birth. What are the signs or symptoms? The symptoms vary depending on the type of seizure you have. Symptoms during a seizure Having convulsions. This means shaking with fast, jerky movements of muscles. Stiffness of the body. Breathing problems. Being confused. Staring or not responding to sound or touch. Head nodding, eye blinking, eye twitching, or fast eye movements. Drooling, grunting, or making clicking sounds with your mouth. Losing control of when you pee or poop. Symptoms before a seizure Feeling afraid, worried, or nervous. Feeling like you may vomit. Vertigo. This feels like: You are moving when you're not. Things around you are moving when they're not. Dj vu. This is a feeling of having seen or heard something before. Odd tastes or smells. Changes in how you see. You may see flashing lights or spots. Symptoms after a seizure Being confused. Feeling sleepy. Headache. Sore muscles. How is this diagnosed? A seizure may be diagnosed based on: A description of your symptoms. Video of your seizures can be helpful. Your medical history. A physical exam.  Tests, such as: Blood tests. CT scan. MRI. Electroencephalogram, or EEG. This test measures electrical activity in the brain. A test of your spinal fluid. This is called a spinal tap or lumbar puncture. How is this treated? If your seizure stops on its own, you will not need treatment. If your  seizure lasts longer than 5 minutes, you'll normally need treatment. This may include: Medicines given through an IV. Avoiding things, such as medicines, that are known to cause your seizures. Medicines to prevent seizures. These are called antiepileptics. A device to prevent or control seizures. Eating foods that are low in carbohydrates and high in fat (ketogenic diet). Surgery. This is sometimes needed if you keep having seizures. Follow these instructions at home: Medicines Take your medicines only as told by your health care provider. Avoid anything that may keep your medicine from working, such as alcohol. Activity Follow your provider's advice about driving, swimming, and doing other things that would be dangerous if you had a seizure. Wait until your provider says it's safe for you to do these things. If you live in the [place] ask your local department of motor vehicles [facility]) when you can drive. Get enough rest and sleep. Not getting enough sleep can make seizures more likely to happen. Teaching others  Teach friends and family what to do if you have a seizure. Tell them to: Help you get down to the ground safely. Protect your head and body. Loosen any clothing around your neck. Turn you on your side. This helps keep your airway clear if you vomit. Know whether or not you need emergency care. Stay with you until you are better. Also, tell them what not to do if you have a seizure. Tell them: They should not hold you down. They should not put anything in your mouth. General instructions Avoid anything that has caused you to have seizures. Keep a seizure diary. Write down: What you remember about each seizure. What you think might have caused each seizure. Keep all follow-up visits. Your provider may need to monitor your progress. Contact a health care provider if: You have another seizure or seizures. Call each time you have a seizure. You have a change in how often or when  you have seizures. You keep having seizures with treatment. You have symptoms of being sick or having an infection. You are not able to take your medicine. Get help right away if: You have or someone has seen you have: A seizure that lasts longer than 5 minutes. Many seizures in a row and you don't feel better between seizures. A seizure that makes it harder to breathe. A seizure that leaves you unable to speak or use a part of your body. You didn't wake up right away after a seizure. You injure yourself during a seizure. You have confusion or pain right after a seizure. These symptoms may be an emergency. Call 911 right away. Do not wait to see if the symptoms will go away. Do not drive yourself to the hospital. This information is not intended to replace advice given to you by your health care provider. Make sure you discuss any questions you have with your health care provider. Document Revised: 04/29/2023 Document Reviewed: 09/09/2022 Elsevier Patient Education  2024 [employer].

## 2024-02-10 ENCOUNTER — Ambulatory Visit: Payer: Self-pay | Admitting: Nurse Practitioner

## 2024-02-10 DIAGNOSIS — R7989 Other specified abnormal findings of blood chemistry: Secondary | ICD-10-CM

## 2024-02-10 LAB — CBC WITH DIFFERENTIAL/PLATELET
Basophils Absolute: 0.1 10*3/uL (ref 0.0–0.2)
Basos: 2 %
EOS (ABSOLUTE): 0.4 10*3/uL (ref 0.0–0.4)
Eos: 5 %
Hematocrit: 47.2 % (ref 37.5–51.0)
Hemoglobin: 15.7 g/dL (ref 13.0–17.7)
Immature Grans (Abs): 0 10*3/uL (ref 0.0–0.1)
Immature Granulocytes: 0 %
Lymphocytes Absolute: 3.4 10*3/uL — ABNORMAL HIGH (ref 0.7–3.1)
Lymphs: 42 %
MCH: 31.6 pg (ref 26.6–33.0)
MCHC: 33.3 g/dL (ref 31.5–35.7)
MCV: 95 fL (ref 79–97)
Monocytes Absolute: 0.7 10*3/uL (ref 0.1–0.9)
Monocytes: 9 %
Neutrophils Absolute: 3.4 10*3/uL (ref 1.4–7.0)
Neutrophils: 42 %
Platelets: 288 10*3/uL (ref 150–450)
RBC: 4.97 x10E6/uL (ref 4.14–5.80)
RDW: 11.9 % (ref 11.6–15.4)
WBC: 8.1 10*3/uL (ref 3.4–10.8)

## 2024-02-10 LAB — LIPID PANEL W/O CHOL/HDL RATIO
Cholesterol, Total: 146 mg/dL (ref 100–199)
HDL: 47 mg/dL (ref >39)
LDL Chol Calc (NIH): 89 mg/dL (ref 0–99)
Triglycerides: 43 mg/dL (ref 0–149)
VLDL Cholesterol Cal: 10 mg/dL (ref 5–40)

## 2024-02-10 LAB — COMPREHENSIVE METABOLIC PANEL WITH GFR
ALT: 10 IU/L (ref 0–44)
AST: 18 IU/L (ref 0–40)
Albumin: 5 g/dL (ref 4.1–5.1)
Alkaline Phosphatase: 126 IU/L — ABNORMAL HIGH (ref 44–121)
BUN/Creatinine Ratio: 10 (ref 9–20)
BUN: 11 mg/dL (ref 6–24)
Bilirubin Total: 0.8 mg/dL (ref 0.0–1.2)
CO2: 23 mmol/L (ref 20–29)
Calcium: 10.5 mg/dL — ABNORMAL HIGH (ref 8.7–10.2)
Chloride: 100 mmol/L (ref 96–106)
Creatinine, Ser: 1.1 mg/dL (ref 0.76–1.27)
Globulin, Total: 3 g/dL (ref 1.5–4.5)
Glucose: 63 mg/dL — ABNORMAL LOW (ref 70–99)
Potassium: 4.2 mmol/L (ref 3.5–5.2)
Sodium: 140 mmol/L (ref 134–144)
Total Protein: 8 g/dL (ref 6.0–8.5)
eGFR: 86 mL/min/{1.73_m2} (ref >59)

## 2024-02-10 LAB — TSH: TSH: 0.282 u[IU]/mL — ABNORMAL LOW (ref 0.450–4.500)

## 2024-02-10 LAB — LAMOTRIGINE LEVEL: Lamotrigine Lvl: 13.1 ug/mL (ref 2.0–20.0)

## 2024-02-10 NOTE — Progress Notes (Signed)
 Good day, please let Edward Welch know his labs have returned + needs lab only visit in 4 weeks please: Good day Edward Welch, your labs have returned: - CBC shows mild elevation in neutrophils, a type of white blood cell. This fluctuates with you and is often related to medications like Lamictal .  We will continue to monitor. - Kidney and liver function are normal.  Calcium level a little elevated, try to cut back a little bit on calcium rich foods. - TSH, thyroid level, is a little too low.  More overactive.  This can fluctuate.  I would like to recheck this outpatient in 4 weeks to see if returns to normal range. - Remainder of labs stable.  No changes needed.  Any questions? Keep being amazing!!  Thank you for allowing me to participate in your care.  I appreciate you. Kindest regards, Erilyn Pearman

## 2025-02-12 ENCOUNTER — Encounter: Admitting: Nurse Practitioner
